# Patient Record
Sex: Female | Born: 1940 | Race: White | Hispanic: No | Marital: Single | Smoking: Former smoker
Health system: Southern US, Community
[De-identification: ages and names within clinical notes are randomized; demographics above are authoritative.]

## PROBLEM LIST (undated history)

## (undated) DIAGNOSIS — G309 Alzheimer's disease, unspecified: Secondary | ICD-10-CM

## (undated) DIAGNOSIS — F0280 Dementia in other diseases classified elsewhere without behavioral disturbance: Secondary | ICD-10-CM

---

## 2008-06-25 ENCOUNTER — Ambulatory Visit: Payer: Self-pay | Admitting: Family Medicine

## 2009-06-23 ENCOUNTER — Ambulatory Visit: Payer: Self-pay | Admitting: Family Medicine

## 2011-10-05 ENCOUNTER — Ambulatory Visit: Payer: Self-pay | Admitting: Family Medicine

## 2013-08-05 DIAGNOSIS — F0390 Unspecified dementia without behavioral disturbance: Secondary | ICD-10-CM | POA: Insufficient documentation

## 2015-10-26 DIAGNOSIS — R441 Visual hallucinations: Secondary | ICD-10-CM | POA: Insufficient documentation

## 2017-01-03 ENCOUNTER — Emergency Department
Admission: EM | Admit: 2017-01-03 | Discharge: 2017-01-04 | Disposition: A | Discharge status: Home / Self Care | Payer: Medicare Other | Attending: Emergency Medicine | Admitting: Emergency Medicine

## 2017-01-03 ENCOUNTER — Encounter: Payer: Self-pay | Admitting: Emergency Medicine

## 2017-01-03 DIAGNOSIS — E039 Hypothyroidism, unspecified: Secondary | ICD-10-CM | POA: Insufficient documentation

## 2017-01-03 DIAGNOSIS — R112 Nausea with vomiting, unspecified: Secondary | ICD-10-CM | POA: Diagnosis present

## 2017-01-03 DIAGNOSIS — N390 Urinary tract infection, site not specified: Secondary | ICD-10-CM | POA: Diagnosis not present

## 2017-01-03 DIAGNOSIS — K8020 Calculus of gallbladder without cholecystitis without obstruction: Secondary | ICD-10-CM | POA: Diagnosis not present

## 2017-01-03 LAB — CBC WITH DIFFERENTIAL/PLATELET
BASOS ABS: 0 10*3/uL (ref 0–0.1)
Basophils Relative: 0 %
EOS PCT: 1 %
Eosinophils Absolute: 0.1 10*3/uL (ref 0–0.7)
HEMATOCRIT: 38.3 % (ref 35.0–47.0)
Hemoglobin: 12.5 g/dL (ref 12.0–16.0)
LYMPHS PCT: 4 %
Lymphs Abs: 0.7 10*3/uL — ABNORMAL LOW (ref 1.0–3.6)
MCH: 28.2 pg (ref 26.0–34.0)
MCHC: 32.6 g/dL (ref 32.0–36.0)
MCV: 86.6 fL (ref 80.0–100.0)
MONO ABS: 1.2 10*3/uL — AB (ref 0.2–0.9)
MONOS PCT: 7 %
NEUTROS ABS: 15.1 10*3/uL — AB (ref 1.4–6.5)
Neutrophils Relative %: 88 %
PLATELETS: 222 10*3/uL (ref 150–440)
RBC: 4.42 MIL/uL (ref 3.80–5.20)
RDW: 16.2 % — AB (ref 11.5–14.5)
WBC: 17.1 10*3/uL — ABNORMAL HIGH (ref 3.6–11.0)

## 2017-01-03 MED ORDER — ONDANSETRON HCL 4 MG/2ML IJ SOLN
4.0000 mg | Freq: Once | INTRAMUSCULAR | Status: AC
  Administered 2017-01-03: 4 mg via INTRAVENOUS
  Filled 2017-01-03: qty 2

## 2017-01-03 MED ORDER — SODIUM CHLORIDE 0.9 % IV BOLUS (SEPSIS)
1000.0000 mL | Freq: Once | INTRAVENOUS | Status: AC
  Administered 2017-01-03: 1000 mL via INTRAVENOUS

## 2017-01-03 NOTE — ED Provider Notes (Signed)
[facility] Emergency Department Provider Note   ____________________________________________   First MD Initiated Contact with Patient 01/03/17 2316     (approximate)  I have reviewed the triage vital signs and the nursing notes.   HISTORY  Chief Complaint Emesis  Limited by dementia  HPI CERENITI CURB is a 76 y.o. female brought to the ED from [facility] by her son with a chief complaint of vomiting.Son was notified by nursing staff that patient has been vomiting today. To his knowledge, there has been no fever, chills, chest pain, shortness of breath, abdominal pain, dysuria, diarrhea. Denies recent travel, trauma or antibiotic use. Nothing makes her symptoms better or worse.   Past medical history Dementia Hypothyroidism  There are no active problems to display for this patient.   Past surgical history None  Prior to Admission medications   Medication Sig Start Date End Date Taking? Authorizing Provider  cephALEXin (KEFLEX) 250 MG capsule Take 1 capsule (250 mg total) by mouth 3 (three) times daily. 01/04/17   Irean Hong, MD  HYDROcodone-acetaminophen (NORCO) 5-325 MG tablet Take 1 tablet by mouth every 6 (six) hours as needed for moderate pain. 01/04/17   Irean Hong, MD  ondansetron (ZOFRAN ODT) 4 MG disintegrating tablet Take 1 tablet (4 mg total) by mouth every 8 (eight) hours as needed for nausea or vomiting. 01/04/17   Irean Hong, MD    Allergies Patient has no known allergies.  No family history on file.  Social History Social History  Substance Use Topics  . Smoking status: Not on file  . Smokeless tobacco: Not on file  . Alcohol use Not on file  Nonsmoker  Review of Systems  Constitutional: No fever/chills. Eyes: No visual changes. ENT: No sore throat. Cardiovascular: Denies chest pain. Respiratory: Denies shortness of breath. Gastrointestinal: No abdominal pain.  Positive for nausea and vomiting.  No diarrhea.  No  constipation. Genitourinary: Negative for dysuria. Musculoskeletal: Negative for back pain. Skin: Negative for rash. Neurological: Negative for headaches, focal weakness or numbness.   ____________________________________________   PHYSICAL EXAM:  VITAL SIGNS: ED Triage Vitals  Enc Vitals Group     BP 01/03/17 2302 115/62     Pulse Rate 01/03/17 2302 72     Resp 01/03/17 2302 20     Temp 01/03/17 2302 98.3 F (36.8 C)     Temp Source 01/03/17 2302 Oral     SpO2 01/03/17 2302 96 %     Weight 01/03/17 2303 143 lb (64.9 kg)     Height 01/03/17 2303  (1.6 m)     Head Circumference --      Peak Flow --      Pain Score --      Pain Loc --      Pain Edu? --      Excl. in GC? --     Constitutional: Alert and oriented. Well appearing and in no acute distress. Eyes: Conjunctivae are normal. PERRL. EOMI. Head: Atraumatic. Nose: No congestion/rhinnorhea. Mouth/Throat: Mucous membranes are moist.  Oropharynx non-erythematous. Neck: No stridor.   Cardiovascular: Normal rate, regular rhythm. Grossly normal heart sounds.  Good peripheral circulation. Respiratory: Normal respiratory effort.  No retractions. Lungs CTAB. Gastrointestinal: Soft and nontender to light or deep palpation. No distention. No abdominal bruits. No CVA tenderness. Musculoskeletal: No lower extremity tenderness nor edema.  No joint effusions. Neurologic:  Normal speech and language. Pleasantly confused. No gross focal neurologic deficits are appreciated.  Skin:  Skin is warm, dry and intact. No rash noted. Psychiatric: Mood and affect are normal. Speech and behavior are normal.  ____________________________________________   LABS (all labs ordered are listed, but only abnormal results are displayed)  Labs Reviewed  CBC WITH DIFFERENTIAL/PLATELET - Abnormal; Notable for the following:       Result Value   WBC 17.1 (*)    RDW 16.2 (*)    Neutro Abs 15.1 (*)    Lymphs Abs 0.7 (*)    Monocytes Absolute  1.2 (*)    All other components within normal limits  COMPREHENSIVE METABOLIC PANEL - Abnormal; Notable for the following:    Potassium 3.3 (*)    Glucose, Bld 104 (*)    ALT 11 (*)    All other components within normal limits  URINALYSIS, COMPLETE (UACMP) WITH MICROSCOPIC - Abnormal; Notable for the following:    Color, Urine YELLOW (*)    APPearance HAZY (*)    Leukocytes, UA TRACE (*)    Squamous Epithelial / LPF 0-5 (*)    All other components within normal limits  LIPASE, BLOOD  TROPONIN I   ____________________________________________  EKG  ED ECG REPORT I, Deval Mroczka J, the attending physician, personally viewed and interpreted this ECG.   Date: 01/04/2017  EKG Time: 0000  Rate: 60  Rhythm: normal EKG, normal sinus rhythm  Axis: Normal  Intervals:none  ST&T Change: Nonspecific  ____________________________________________  RADIOLOGY  CT abdomen and pelvis interpreted per Dr. Clovis Riley: 1. Hiatal hernia.  2. Uncomplicated mild diverticulosis.  3. Cholelithiasis  4. No bowel obstruction or perforation. No focal inflammation. No  ascites.   Ultrasound interpreted per Dr. Phill Myron: 1. Cholelithiasis with no additional sonographic features to suggest  acute cholecystitis.  2. No biliary dilatation.   ____________________________________________   PROCEDURES  Procedure(s) performed: None  Procedures  Critical Care performed: No  ____________________________________________   INITIAL IMPRESSION / ASSESSMENT AND PLAN / ED COURSE  Pertinent labs & imaging results that were available during my care of the patient were reviewed by me and considered in my medical decision making (see chart for details).  76 year old female brought from nursing facility for vomiting. She is afebrile, normotensive without appreciable abdominal tenderness on exam. Will obtain screening lab work including troponin, urinalysis, initiate IV fluid resuscitation with IV antiemetic  and reassess.  Clinical Course as of Jan 04 430  Thu Jan 04, 2017  0039 Updated patient and son of laboratory urinalysis results. Concerned that trace leukocytes in urine does not adequately explain patient's leukocytosis. Will proceed with CT abdomen/pelvis to evaluate for intra-abdominal pathology. Currently patient states nausea has improved after IV Zofran.  [JS]  0154 Updated patient and son of CT imaging results. Will proceed to ultrasound to evaluate for cholecystitis. Patient resting; no emesis since being in the emergency department.  [JS]  0423 Patient resting in no acute distress. Eager for discharge home. She has not vomited in the 5+ hours since she has been in the emergency department and she has tolerated 2 bottles of oral contrast without difficulty. Will place patient on Keflex for mild UTI. She will follow-up with general surgery on an outpatient basis. Strict return precautions given. Patient and son verbalize understanding and agree with plan of care.  [JS]    Clinical Course User Index [JS] Irean Hong, MD     ____________________________________________   FINAL CLINICAL IMPRESSION(S) / ED DIAGNOSES  Final diagnoses:  Non-intractable vomiting with nausea, unspecified vomiting type  Calculus of  gallbladder without cholecystitis without obstruction  Urinary tract infection without hematuria, site unspecified      NEW MEDICATIONS STARTED DURING THIS VISIT:  New Prescriptions   CEPHALEXIN (KEFLEX) 250 MG CAPSULE    Take 1 capsule (250 mg total) by mouth 3 (three) times daily.   HYDROCODONE-ACETAMINOPHEN (NORCO) 5-325 MG TABLET    Take 1 tablet by mouth every 6 (six) hours as needed for moderate pain.   ONDANSETRON (ZOFRAN ODT) 4 MG DISINTEGRATING TABLET    Take 1 tablet (4 mg total) by mouth every 8 (eight) hours as needed for nausea or vomiting.     Note:  This document was prepared using Dragon voice recognition software and may include unintentional dictation  errors.    Irean Hong, MD 01/04/17 [phone]

## 2017-01-03 NOTE — ED Triage Notes (Signed)
Pt in with co vomiting today, denies any diarrhea or dysuria. Pt has hx of alzheimers son here with her, pt is poor historian.

## 2017-01-04 ENCOUNTER — Emergency Department: Payer: Medicare Other

## 2017-01-04 ENCOUNTER — Encounter: Payer: Self-pay | Admitting: Radiology

## 2017-01-04 DIAGNOSIS — K8020 Calculus of gallbladder without cholecystitis without obstruction: Secondary | ICD-10-CM | POA: Diagnosis not present

## 2017-01-04 LAB — URINALYSIS, COMPLETE (UACMP) WITH MICROSCOPIC
BILIRUBIN URINE: NEGATIVE
Bacteria, UA: NONE SEEN
GLUCOSE, UA: NEGATIVE mg/dL
HGB URINE DIPSTICK: NEGATIVE
KETONES UR: NEGATIVE mg/dL
NITRITE: NEGATIVE
PH: 5 (ref 5.0–8.0)
PROTEIN: NEGATIVE mg/dL
Specific Gravity, Urine: 1.023 (ref 1.005–1.030)

## 2017-01-04 LAB — COMPREHENSIVE METABOLIC PANEL
ALBUMIN: 4.2 g/dL (ref 3.5–5.0)
ALK PHOS: 60 U/L (ref 38–126)
ALT: 11 U/L — AB (ref 14–54)
ANION GAP: 7 (ref 5–15)
AST: 17 U/L (ref 15–41)
BUN: 14 mg/dL (ref 6–20)
CALCIUM: 9.2 mg/dL (ref 8.9–10.3)
CHLORIDE: 105 mmol/L (ref 101–111)
CO2: 25 mmol/L (ref 22–32)
Creatinine, Ser: 0.6 mg/dL (ref 0.44–1.00)
GFR calc non Af Amer: >60 mL/min (ref >60)
GLUCOSE: 104 mg/dL — AB (ref 65–99)
Potassium: 3.3 mmol/L — ABNORMAL LOW (ref 3.5–5.1)
SODIUM: 137 mmol/L (ref 135–145)
Total Bilirubin: 0.6 mg/dL (ref 0.3–1.2)
Total Protein: 7.4 g/dL (ref 6.5–8.1)

## 2017-01-04 LAB — LIPASE, BLOOD: Lipase: 34 U/L (ref 11–51)

## 2017-01-04 LAB — TROPONIN I

## 2017-01-04 MED ORDER — ONDANSETRON 4 MG PO TBDP: 4.0000 mg | ORAL_TABLET | Freq: Three times a day (TID) | ORAL | 0 refills | Status: DC | PRN

## 2017-01-04 MED ORDER — IOPAMIDOL (ISOVUE-300) INJECTION 61%
100.0000 mL | Freq: Once | INTRAVENOUS | Status: AC | PRN
  Administered 2017-01-04: 100 mL via INTRAVENOUS

## 2017-01-04 MED ORDER — CEPHALEXIN 500 MG PO CAPS
500.0000 mg | ORAL_CAPSULE | Freq: Once | ORAL | Status: AC
  Administered 2017-01-04: 500 mg via ORAL
  Filled 2017-01-04: qty 1

## 2017-01-04 MED ORDER — CEPHALEXIN 250 MG PO CAPS: 250.0000 mg | ORAL_CAPSULE | Freq: Three times a day (TID) | ORAL | 0 refills | Status: DC

## 2017-01-04 MED ORDER — HYDROCODONE-ACETAMINOPHEN 5-325 MG PO TABS: 1.0000 | ORAL_TABLET | Freq: Four times a day (QID) | ORAL | 0 refills | Status: DC | PRN

## 2017-01-04 MED ORDER — IOPAMIDOL (ISOVUE-300) INJECTION 61%
30.0000 mL | Freq: Once | INTRAVENOUS | Status: AC | PRN
  Administered 2017-01-04: 30 mL via ORAL

## 2017-01-04 NOTE — Discharge Instructions (Signed)
1. Take antibiotic as prescribed (Keflex 250 mg 3 times daily 7 days). 2. You may take pain and nausea medicines as needed (Norco/Zofran #15). 3. Return to the ER for worsening symptoms, persistent vomiting, difficulty breathing or other concerns.

## 2017-09-21 ENCOUNTER — Emergency Department
Admission: EM | Admit: 2017-09-21 | Discharge: 2017-09-21 | Disposition: A | Discharge status: Home / Self Care | Payer: Medicare Other | Attending: Emergency Medicine | Admitting: Emergency Medicine

## 2017-09-21 ENCOUNTER — Emergency Department: Payer: Medicare Other

## 2017-09-21 DIAGNOSIS — R4182 Altered mental status, unspecified: Secondary | ICD-10-CM | POA: Insufficient documentation

## 2017-09-21 DIAGNOSIS — G309 Alzheimer's disease, unspecified: Secondary | ICD-10-CM | POA: Diagnosis not present

## 2017-09-21 DIAGNOSIS — Z79899 Other long term (current) drug therapy: Secondary | ICD-10-CM | POA: Diagnosis not present

## 2017-09-21 DIAGNOSIS — F0280 Dementia in other diseases classified elsewhere without behavioral disturbance: Secondary | ICD-10-CM | POA: Insufficient documentation

## 2017-09-21 HISTORY — DX: Dementia in other diseases classified elsewhere, unspecified severity, without behavioral disturbance, psychotic disturbance, mood disturbance, and anxiety: F02.80

## 2017-09-21 HISTORY — DX: Alzheimer's disease, unspecified: G30.9

## 2017-09-21 LAB — URINALYSIS, ROUTINE W REFLEX MICROSCOPIC
BILIRUBIN URINE: NEGATIVE
Glucose, UA: NEGATIVE mg/dL
Hgb urine dipstick: NEGATIVE
KETONES UR: NEGATIVE mg/dL
LEUKOCYTES UA: NEGATIVE
NITRITE: NEGATIVE
Protein, ur: NEGATIVE mg/dL
SPECIFIC GRAVITY, URINE: 1.021 (ref 1.005–1.030)
pH: 6 (ref 5.0–8.0)

## 2017-09-21 LAB — URINE DRUG SCREEN, QUALITATIVE (ARMC ONLY)
Amphetamines, Ur Screen: NOT DETECTED
Barbiturates, Ur Screen: NOT DETECTED
Benzodiazepine, Ur Scrn: NOT DETECTED
CANNABINOID 50 NG, UR ~~LOC~~: NOT DETECTED
Cocaine Metabolite,Ur ~~LOC~~: NOT DETECTED
MDMA (ECSTASY) UR SCREEN: NOT DETECTED
METHADONE SCREEN, URINE: NOT DETECTED
Opiate, Ur Screen: NOT DETECTED
Phencyclidine (PCP) Ur S: NOT DETECTED
TRICYCLIC, UR SCREEN: NOT DETECTED

## 2017-09-21 LAB — CBC
HEMATOCRIT: 37.5 % (ref 35.0–47.0)
Hemoglobin: 12.8 g/dL (ref 12.0–16.0)
MCH: 29.6 pg (ref 26.0–34.0)
MCHC: 34 g/dL (ref 32.0–36.0)
MCV: 87 fL (ref 80.0–100.0)
PLATELETS: 208 10*3/uL (ref 150–440)
RBC: 4.31 MIL/uL (ref 3.80–5.20)
RDW: 16.1 % — ABNORMAL HIGH (ref 11.5–14.5)
WBC: 6.1 10*3/uL (ref 3.6–11.0)

## 2017-09-21 LAB — COMPREHENSIVE METABOLIC PANEL
ALT: 9 U/L — ABNORMAL LOW (ref 14–54)
ANION GAP: 8 (ref 5–15)
AST: 16 U/L (ref 15–41)
Albumin: 4 g/dL (ref 3.5–5.0)
Alkaline Phosphatase: 68 U/L (ref 38–126)
BILIRUBIN TOTAL: 0.5 mg/dL (ref 0.3–1.2)
BUN: 15 mg/dL (ref 6–20)
CO2: 26 mmol/L (ref 22–32)
Calcium: 9 mg/dL (ref 8.9–10.3)
Chloride: 105 mmol/L (ref 101–111)
Creatinine, Ser: 0.68 mg/dL (ref 0.44–1.00)
Glucose, Bld: 93 mg/dL (ref 65–99)
POTASSIUM: 3.9 mmol/L (ref 3.5–5.1)
Sodium: 139 mmol/L (ref 135–145)
TOTAL PROTEIN: 7.3 g/dL (ref 6.5–8.1)

## 2017-09-21 LAB — TSH: TSH: 0.134 u[IU]/mL — AB (ref 0.350–4.500)

## 2017-09-21 LAB — TROPONIN I

## 2017-09-21 NOTE — ED Notes (Signed)
Pt has dementia at baseline. Per son, when he arrived around lunch time to [facility] (memory care side), it took him 20 mins to get pt out of bed, which is "very abnormal." He noticed pinpoint pupils at that time and that her right lower forearm has a scratch with two bruised areas which was wrapped. Per facility, they "have no idea what happened or how it got wrapped." Pt pleasant and appears to be in NAD at this time. Son at bedside. VSS.

## 2017-09-21 NOTE — ED Notes (Signed)
Pt ambulatory to wheel chair upon discharge. Pt son (and POA) verbalized understanding of discharge instructions and follow-up. VSS. Skin warm and dry. At baseline for mentation. Pt son is bringing pt home with him rather than to the facility.

## 2017-09-21 NOTE — ED Notes (Signed)
Per lab, troponin and TSH to be added on to blood work already sent.

## 2017-09-21 NOTE — ED Provider Notes (Signed)
[facility] Emergency Department Provider Note   ____________________________________________   First MD Initiated Contact with Patient 09/21/17 1523     (approximate)  I have reviewed the triage vital signs and the nursing notes.   HISTORY  Chief Complaint Altered Mental Status    HPI Mandy Reyes is a 77 y.o. female Patient's son brought her in. He says she has not been acting like herself she's been very sleepy her pupils are pinpoint and her right lower arm has 2 bruises and 3 little scratches on it which could be from somebody's nails perhaps per the facility they had no idea what happened to her wrist to right wrist with a scratches are and how got wrapped. Patient's son reports at the time I saw her she is much more awake.I reviewed her medication list she does have a prescription for hydrocodone when necessary. She denies any pain anywhere at present she is awake and alert although she still has pinpoint pupils.   Past Medical History:  Diagnosis Date  . Alzheimer's dementia     There are no active problems to display for this patient.   History reviewed. No pertinent surgical history.  Prior to Admission medications   Medication Sig Start Date End Date Taking? Authorizing Provider  cephALEXin (KEFLEX) 250 MG capsule Take 1 capsule (250 mg total) by mouth 3 (three) times daily. 01/04/17   Irean Hong, MD  HYDROcodone-acetaminophen (NORCO) 5-325 MG tablet Take 1 tablet by mouth every 6 (six) hours as needed for moderate pain. 01/04/17   Irean Hong, MD  ondansetron (ZOFRAN ODT) 4 MG disintegrating tablet Take 1 tablet (4 mg total) by mouth every 8 (eight) hours as needed for nausea or vomiting. 01/04/17   Irean Hong, MD    Allergies Patient has no known allergies.  History reviewed. No pertinent family history.  Social History Social History   Tobacco Use  . Smoking status: Not on file  Substance Use Topics  . Alcohol use: No   Frequency: Never  . Drug use: No    Review of Systems  Constitutional: No fever/chills Eyes: No visual changes. ENT: No sore throat. Cardiovascular: Denies chest pain. Respiratory: Denies shortness of breath. Gastrointestinal: No abdominal pain.  No nausea, no vomiting.  No diarrhea.  No constipation. Genitourinary: Negative for dysuria. Musculoskeletal: Negative for back pain. Skin: Negative for rash. Neurological: Negative for headaches, focal weakness   ____________________________________________   PHYSICAL EXAM:  VITAL SIGNS: ED Triage Vitals [09/21/17 1402]  Enc Vitals Group     BP 111/70     Pulse Rate 68     Resp 18     Temp 98.2 F (36.8 C)     Temp Source Oral     SpO2 95 %     Weight 143 lb (64.9 kg)     Height      Head Circumference      Peak Flow      Pain Score      Pain Loc      Pain Edu?      Excl. in GC?     Constitutional: Alert. Well appearing and in no acute distress. Eyes: Conjunctivae are normal.  Head: Atraumatic. Nose: No congestion/rhinnorhea. Mouth/Throat: Mucous membranes are moist.  Oropharynx non-erythematous. Neck: No stridor.   Cardiovascular: Normal rate, regular rhythm. Grossly normal heart sounds.  Good peripheral circulation. Respiratory: Normal respiratory effort.  No retractions. Lungs CTAB. Gastrointestinal: Soft and nontender. No distention. No  abdominal bruits. No CVA tenderness. Musculoskeletal: No lower extremity tenderness nor edema.  No joint effusions. Neurologic:  Normal speech and language. No gross focal neurologic deficits are appreciated. No gait instability. Skin:  Skin is warm, dry and intact. No rash noted. Psychiatric: Mood and affect are normal. Speech and behavior are normal.  ____________________________________________   LABS (all labs ordered are listed, but only abnormal results are displayed)  Labs Reviewed  CBC - Abnormal; Notable for the following components:      Result Value   RDW 16.1  (*)    All other components within normal limits  COMPREHENSIVE METABOLIC PANEL - Abnormal; Notable for the following components:   ALT 9 (*)    All other components within normal limits  URINALYSIS, ROUTINE W REFLEX MICROSCOPIC - Abnormal; Notable for the following components:   Color, Urine YELLOW (*)    APPearance CLEAR (*)    All other components within normal limits  TSH - Abnormal; Notable for the following components:   TSH 0.134 (*)    All other components within normal limits  URINE DRUG SCREEN, QUALITATIVE ([facility] ONLY)  TROPONIN I   ____________________________________________  EKG  EKG read and interpreted by me shows sinus bradycardia rate of 52 normal axis and low voltage and some of the precordial leads but no acute ST-T changes _  the patient on the monitor nurses noted that she appeared to be in a flutter. EKG was repeated shows a flutter with a heart rate of 53 still normal axis not seeing any acute ST-T wave changes______  within 5 minutes patient's back out of a flutter into normal sinus rhythm_____________________________________  RADIOLOGY    ____________________________________________   PROCEDURES  Procedure(s) performed:   Procedures  Critical Care performed:   ____________________________________________   INITIAL IMPRESSION / ASSESSMENT AND PLAN / ED COURSE    Clinical Course as of Sep 21 2142  Fri Sep 21, 2017  1708 Chloride: 105 [PM]    Clinical Course User Index [PM] Arnaldo NatalMalinda, Ameenah Prosser F, MD     ____________________________________________   FINAL CLINICAL IMPRESSION(S) / ED DIAGNOSES  Final diagnoses:  Altered mental status, unspecified altered mental status type     ED Discharge Orders    None       Note:  This document was prepared using Dragon voice recognition software and may include unintentional dictation errors.    Arnaldo NatalMalinda, Halle Davlin F, MD 09/21/17 2144

## 2017-09-21 NOTE — ED Triage Notes (Signed)
Pt brought in by son who has legal power of attorney and presents paperwork with him.  Son picked pt up from [facility] today and states she is in the Alzheimer's wing there.  Per son, pt is acting more lethargic and different from her baseline.  Per son, he feels that pt is been given medication that is not on her medication list and is unable to get any answers from the facility.  Facility provided med list, but per son this is very out of the ordinary for his mother.  Pt's pupils are pinpoint and nonreactive to light at this time during assessment.  Per son they have been like this for 2 hours.  Pt is able to talk, but is not oriented to anything, but herself at this time.

## 2017-09-21 NOTE — Discharge Instructions (Signed)
please return for any further problems. At present all the tests CAT scan etc. are negative. since she is back at baseline I will let her go.

## 2017-09-21 NOTE — ED Notes (Signed)
ED Provider at bedside. 

## 2018-01-05 ENCOUNTER — Emergency Department: Payer: Medicare Other

## 2018-01-05 ENCOUNTER — Emergency Department
Admission: EM | Admit: 2018-01-05 | Discharge: 2018-01-05 | Disposition: A | Discharge status: Home / Self Care | Payer: Medicare Other | Attending: Emergency Medicine | Admitting: Emergency Medicine

## 2018-01-05 ENCOUNTER — Other Ambulatory Visit: Payer: Self-pay

## 2018-01-05 DIAGNOSIS — R42 Dizziness and giddiness: Secondary | ICD-10-CM | POA: Diagnosis present

## 2018-01-05 DIAGNOSIS — F0280 Dementia in other diseases classified elsewhere without behavioral disturbance: Secondary | ICD-10-CM | POA: Insufficient documentation

## 2018-01-05 DIAGNOSIS — E860 Dehydration: Secondary | ICD-10-CM | POA: Diagnosis not present

## 2018-01-05 DIAGNOSIS — Z79899 Other long term (current) drug therapy: Secondary | ICD-10-CM | POA: Diagnosis not present

## 2018-01-05 DIAGNOSIS — G309 Alzheimer's disease, unspecified: Secondary | ICD-10-CM | POA: Insufficient documentation

## 2018-01-05 DIAGNOSIS — N390 Urinary tract infection, site not specified: Secondary | ICD-10-CM | POA: Diagnosis not present

## 2018-01-05 LAB — URINALYSIS, COMPLETE (UACMP) WITH MICROSCOPIC
Bilirubin Urine: NEGATIVE
GLUCOSE, UA: NEGATIVE mg/dL
KETONES UR: NEGATIVE mg/dL
Leukocytes, UA: NEGATIVE
Nitrite: POSITIVE — AB
PROTEIN: NEGATIVE mg/dL
Specific Gravity, Urine: 1.011 (ref 1.005–1.030)
pH: 5 (ref 5.0–8.0)

## 2018-01-05 LAB — BASIC METABOLIC PANEL
Anion gap: 4 — ABNORMAL LOW (ref 5–15)
BUN: 19 mg/dL (ref 6–20)
CO2: 27 mmol/L (ref 22–32)
CREATININE: 0.66 mg/dL (ref 0.44–1.00)
Calcium: 8.5 mg/dL — ABNORMAL LOW (ref 8.9–10.3)
Chloride: 108 mmol/L (ref 101–111)
GFR calc Af Amer: >60 mL/min (ref >60)
Glucose, Bld: 109 mg/dL — ABNORMAL HIGH (ref 65–99)
Potassium: 3.7 mmol/L (ref 3.5–5.1)
SODIUM: 139 mmol/L (ref 135–145)

## 2018-01-05 LAB — TROPONIN I: Troponin I: <0.03 ng/mL (ref <0.03)

## 2018-01-05 LAB — CBC
HCT: 35.4 % (ref 35.0–47.0)
Hemoglobin: 12.1 g/dL (ref 12.0–16.0)
MCH: 29.8 pg (ref 26.0–34.0)
MCHC: 34.3 g/dL (ref 32.0–36.0)
MCV: 86.9 fL (ref 80.0–100.0)
Platelets: 149 10*3/uL — ABNORMAL LOW (ref 150–440)
RBC: 4.07 MIL/uL (ref 3.80–5.20)
RDW: 16 % — ABNORMAL HIGH (ref 11.5–14.5)
WBC: 7 10*3/uL (ref 3.6–11.0)

## 2018-01-05 LAB — MAGNESIUM: MAGNESIUM: 1.8 mg/dL (ref 1.7–2.4)

## 2018-01-05 MED ORDER — CEPHALEXIN 500 MG PO CAPS: 500.0000 mg | ORAL_CAPSULE | Freq: Three times a day (TID) | ORAL | 0 refills | Status: AC

## 2018-01-05 MED ORDER — SODIUM CHLORIDE 0.9 % IV BOLUS
500.0000 mL | Freq: Once | INTRAVENOUS | Status: AC
  Administered 2018-01-05: 500 mL via INTRAVENOUS

## 2018-01-05 MED ORDER — SODIUM CHLORIDE 0.9 % IV SOLN
1.0000 g | Freq: Once | INTRAVENOUS | Status: AC
  Administered 2018-01-05: 1 g via INTRAVENOUS
  Filled 2018-01-05: qty 10

## 2018-01-05 NOTE — ED Provider Notes (Addendum)
[facility] Emergency Department Provider Note  ____________________________________________   I have reviewed the triage vital signs and the nursing notes. Where available I have reviewed prior notes and, if possible and indicated, outside hospital notes.    HISTORY  Chief Complaint Near Syncope    HPI JENINA MOENING is a 77 y.o. female presents today after feeling lightheaded when she was in the shower.  Patient was in the shower for about 10 to 12 minutes, with a heat up, and became lightheaded.  Did not pass out.  She was mildly orthostatic with EMS.  They gave her some fluid she has no complaints.  There is no antecedent complaints she does not complain of chest pain shortness breath nausea vomiting diarrhea dysuria or urinary tract infection symptoms.  Her family member state that she is at baseline.  Nontender to have any chest pain, she did not feel that she was going to pass out she states she felt lightheaded however.     Past Medical History:  Diagnosis Date  . Alzheimer's dementia     There are no active problems to display for this patient.   No past surgical history on file.  Prior to Admission medications   Medication Sig Start Date End Date Taking? Authorizing Provider  cephALEXin (KEFLEX) 250 MG capsule Take 1 capsule (250 mg total) by mouth 3 (three) times daily. 01/04/17   Irean Hong, MD  HYDROcodone-acetaminophen (NORCO) 5-325 MG tablet Take 1 tablet by mouth every 6 (six) hours as needed for moderate pain. 01/04/17   Irean Hong, MD  ondansetron (ZOFRAN ODT) 4 MG disintegrating tablet Take 1 tablet (4 mg total) by mouth every 8 (eight) hours as needed for nausea or vomiting. 01/04/17   Irean Hong, MD    Allergies Patient has no known allergies.  No family history on file.  Social History Social History   Tobacco Use  . Smoking status: Not on file  Substance Use Topics  . Alcohol use: No    Frequency: Never  . Drug use: No     Review of Systems Constitutional: No fever/chills Eyes: No visual changes. ENT: No sore throat. No stiff neck no neck pain Cardiovascular: Denies chest pain. Respiratory: Denies shortness of breath. Gastrointestinal:   no vomiting.  No diarrhea.  No constipation. Genitourinary: Negative for dysuria. Musculoskeletal: Negative lower extremity swelling Skin: Negative for rash. Neurological: Negative for severe headaches, focal weakness or numbness.   ____________________________________________   PHYSICAL EXAM:  VITAL SIGNS: ED Triage Vitals  Enc Vitals Group     BP 01/05/18 1218 125/68     Pulse Rate 01/05/18 1218 60     Resp 01/05/18 1218 20     Temp 01/05/18 1218 97.7 F (36.5 C)     Temp Source 01/05/18 1218 Oral     SpO2 01/05/18 1218 97 %     Weight 01/05/18 1219 150 lb (68 kg)     Height 01/05/18 1219  (1.626 m)     Head Circumference --      Peak Flow --      Pain Score 01/05/18 1219 0     Pain Loc --      Pain Edu? --      Excl. in GC? --     Constitutional: Alert and oriented name and place unsure of the date, oriented at baseline per family. Well appearing and in no acute distress. Eyes: Conjunctivae are normal Head: Atraumatic HEENT: No congestion/rhinnorhea.  Mucous membranes are moist.  Oropharynx non-erythematous Neck:   Nontender with no meningismus, no masses, no stridor Cardiovascular: Normal rate, regular rhythm. Grossly normal heart sounds.  Good peripheral circulation. Respiratory: Normal respiratory effort.  No retractions. Lungs CTAB. Abdominal: Soft and nontender. No distention. No guarding no rebound Back:  There is no focal tenderness or step off.  there is no midline tenderness there are no lesions noted. there is no CVA tenderness Musculoskeletal: No lower extremity tenderness, no upper extremity tenderness. No joint effusions, no DVT signs strong distal pulses no edema Neurologic:  Normal speech and language. No gross focal  neurologic deficits are appreciated.  Skin:  Skin is warm, dry and intact. No rash noted. Psychiatric: Mood and affect are normal. Speech and behavior are normal.  ____________________________________________   LABS (all labs ordered are listed, but only abnormal results are displayed)  Labs Reviewed  BASIC METABOLIC PANEL  CBC  URINALYSIS, COMPLETE (UACMP) WITH MICROSCOPIC  MAGNESIUM  CBG MONITORING, ED    Pertinent labs  results that were available during my care of the patient were reviewed by me and considered in my medical decision making (see chart for details). ____________________________________________  EKG  I personally interpreted any EKGs ordered by me or triage Sinus rhythm at 61 bpm no acute ST elevation no acute ST depression normal axis unremarkable EKG ____________________________________________  RADIOLOGY  Pertinent labs & imaging results that were available during my care of the patient were reviewed by me and considered in my medical decision making (see chart for details). If possible, patient and/or family made aware of any abnormal findings.  No results found. ____________________________________________    PROCEDURES  Procedure(s) performed: None  Procedures  Critical Care performed: None  ____________________________________________   INITIAL IMPRESSION / ASSESSMENT AND PLAN / ED COURSE  Pertinent labs & imaging results that were available during my care of the patient were reviewed by me and considered in my medical decision making (see chart for details).  Took a long hot shower without having very much for breakfast and felt lightheaded, she has no ongoing complaints and looks well we will give her some fluid recheck orthostatics, check basic blood work urine and chest and reassess  ----------------------------------------- 4:47 PM on 01/05/2018 -----------------------------------------  Patient here with urinary tract infection of  feeling lightheaded after spending a long time in a hot shower.  She is a symptomatically we have given her IV fluids she feels fine, patient family prefer not to be admitted to the hospital which I do not think is unreasonable.  She certainly does not appear to be septic blood work is otherwise reassuring exam is reassuring on serial exam she does not have any symptoms or complaints she is at her baseline and would like to go home.  We did, however, as a precaution give her broad-spectrum antibiotics, urine culture is pending.    ____________________________________________   FINAL CLINICAL IMPRESSION(S) / ED DIAGNOSES  Final diagnoses:  None      This chart was dictated using voice recognition software.  Despite best efforts to proofread,  errors can occur which can change meaning.      Jeanmarie Plant, MD 01/05/18 1259    Jeanmarie Plant, MD 01/05/18 1647    Jeanmarie Plant, MD 01/05/18 [phone]

## 2018-01-05 NOTE — ED Notes (Signed)
Pt's son at bedside reports pt has Alzheimer's disease and thyroid problems denies any other medical problems reports pt has advanced Alzheimer's disease able to answer only simple questions per son

## 2018-01-05 NOTE — ED Notes (Signed)
Pt's son verbalizes understanding of discharge instructions. 

## 2018-01-05 NOTE — Discharge Instructions (Addendum)
Return to the emergency room for any new or worrisome symptoms including lightheadedness, vomiting, fever, inability to eat or drink, lethargy, abdominal pain back pain or any other concerns.  Follow close with primary care.  Take the antibiotics as directed.  If we need to change the antibiotics after the culture results we will let you know.

## 2018-01-05 NOTE — ED Triage Notes (Signed)
Pt presents to ER from home via EMS with complaints of near syncopal episode per EMS pt finished a hot shower reports pt called her son as she was feeling "like she was going to pass out" son help pt to the floor. Pt did not fall, EMS administered NS. EMS did Ortho VS, 20pt BP drop. Pt is awake, alert to self and place.

## 2018-01-08 LAB — URINE CULTURE: Culture: >=100000 — AB

## 2018-05-24 IMAGING — US US ABDOMEN LIMITED
1 series · 14 of 25 positions shown · non-contrast
Comparison: Prior CT from earlier same day.

CLINICAL DATA: Initial evaluation for cholelithiasis.

EXAM:
US ABDOMEN LIMITED - RIGHT UPPER QUADRANT

[Series 1: us abdomen limited · 0.19mm/px · 14 of 46 slices shown]
[im 1/46]
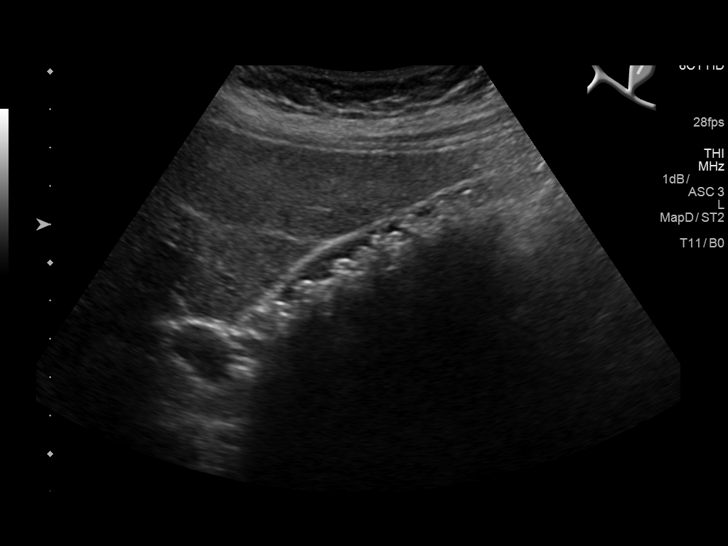
[im 4/46]
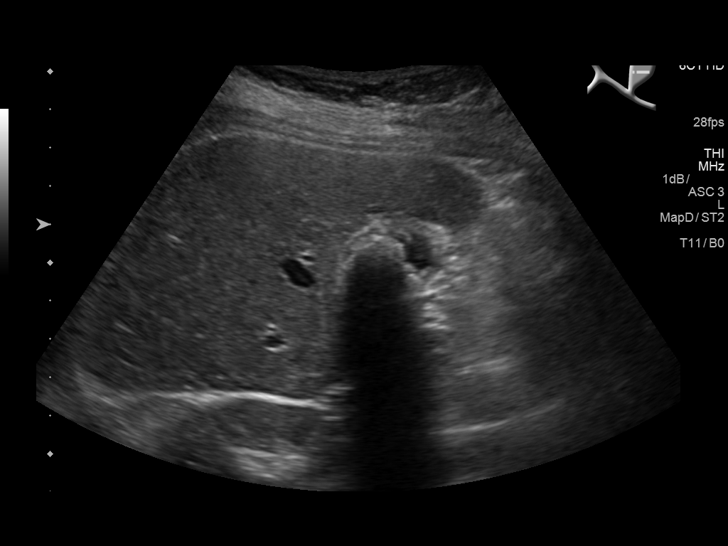
[im 8/46]
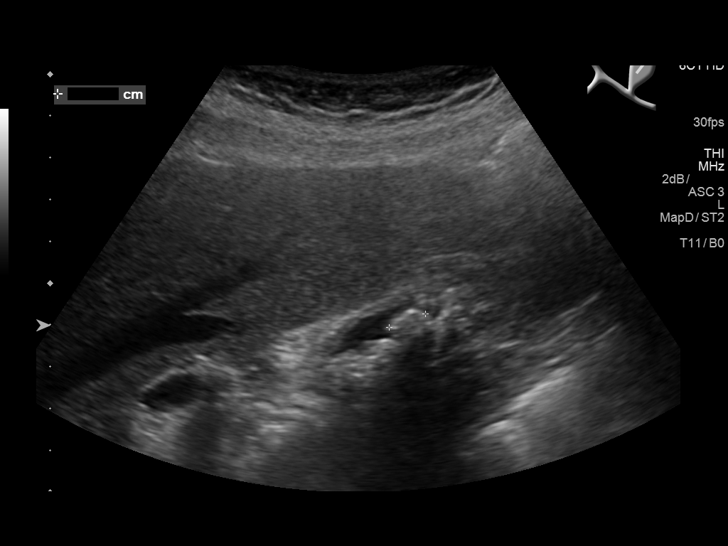
[im 12/46]
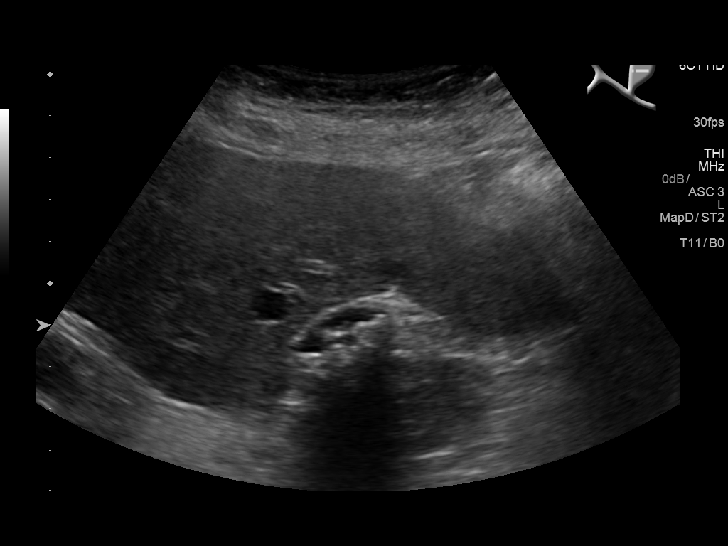
[im 16/46]
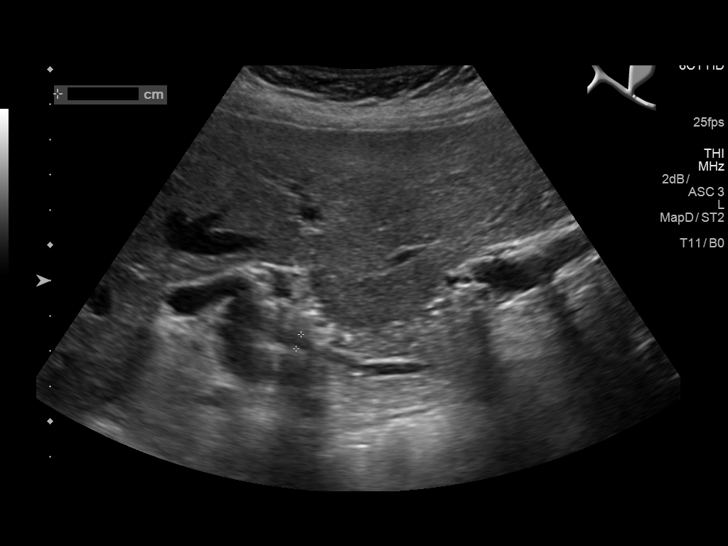
[im 17/46]
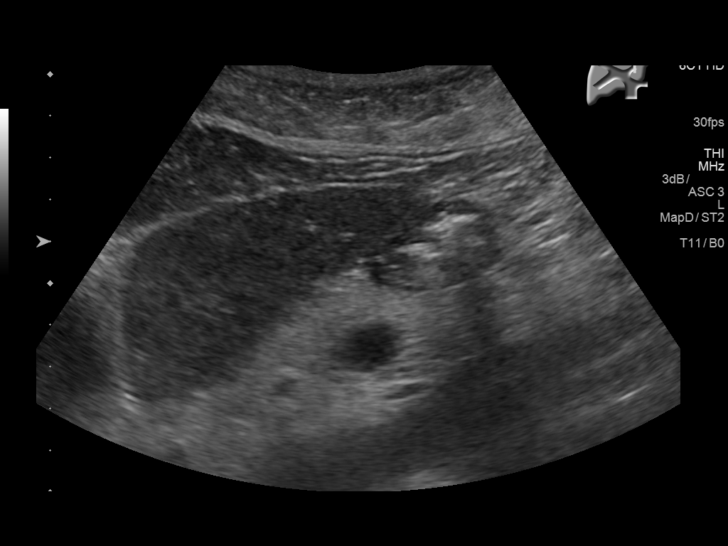
[im 21/46]
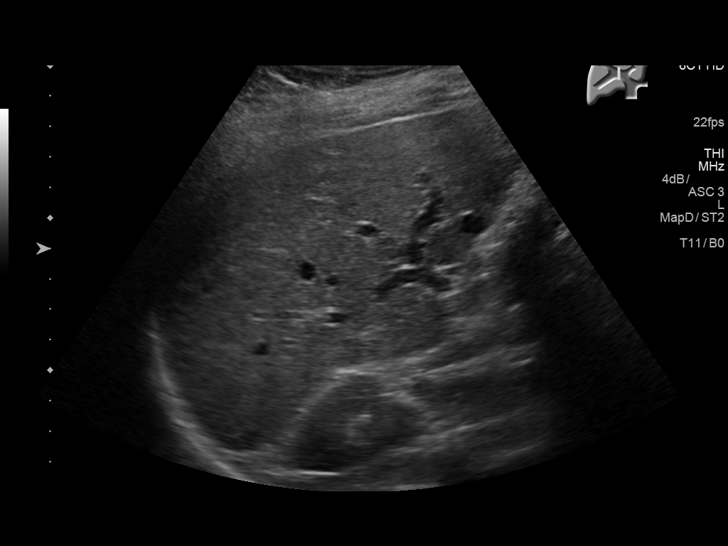
[im 25/46]
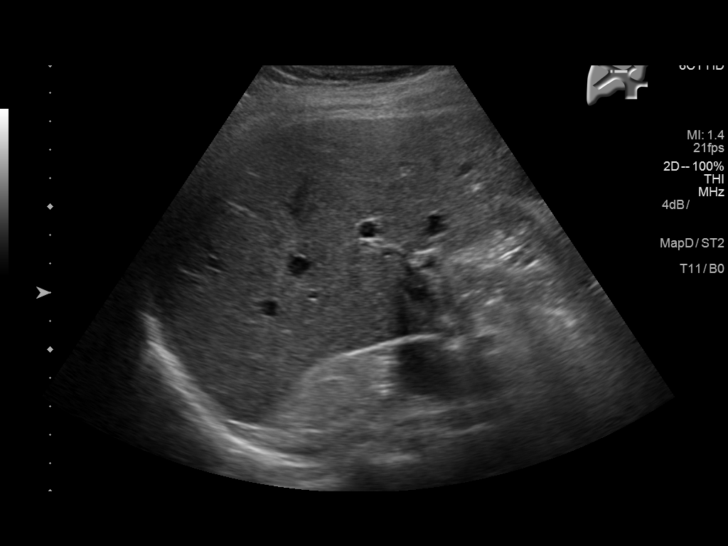
[im 29/46]
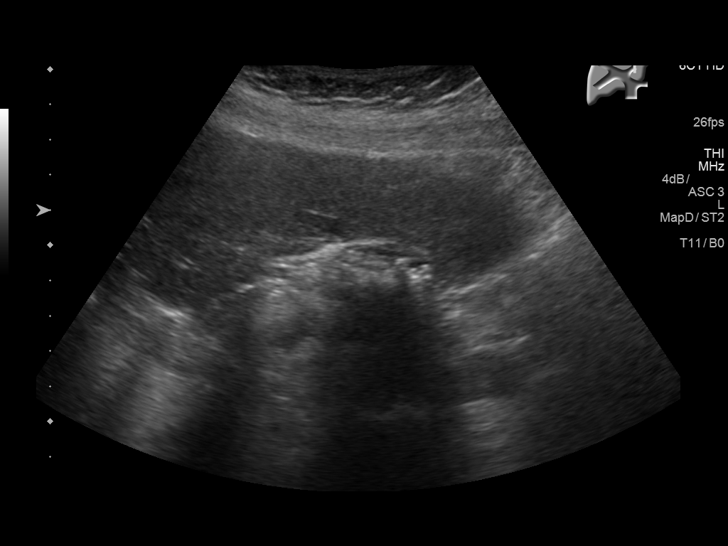
[im 31/46]
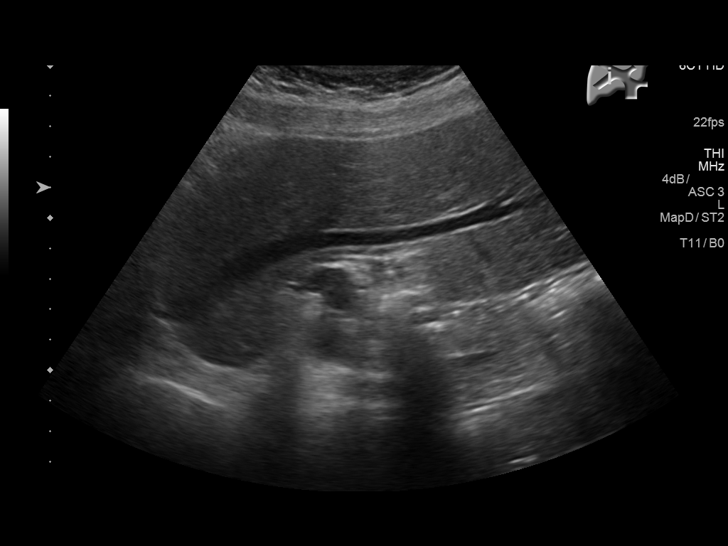
[im 34/46]
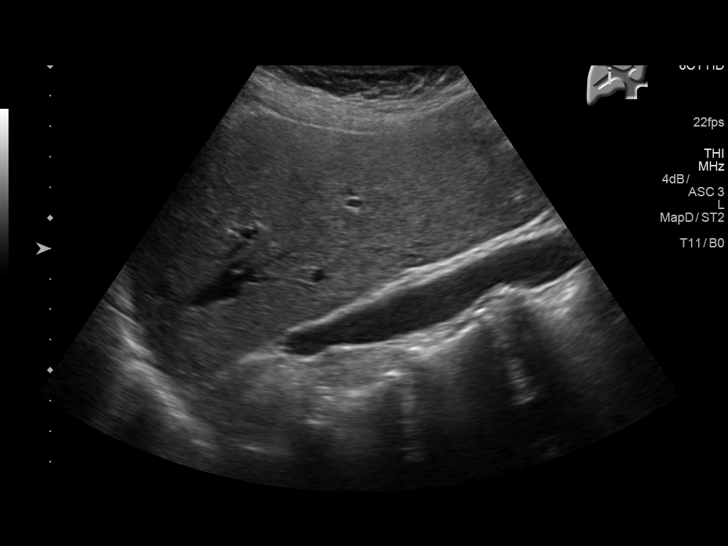
[im 38/46]
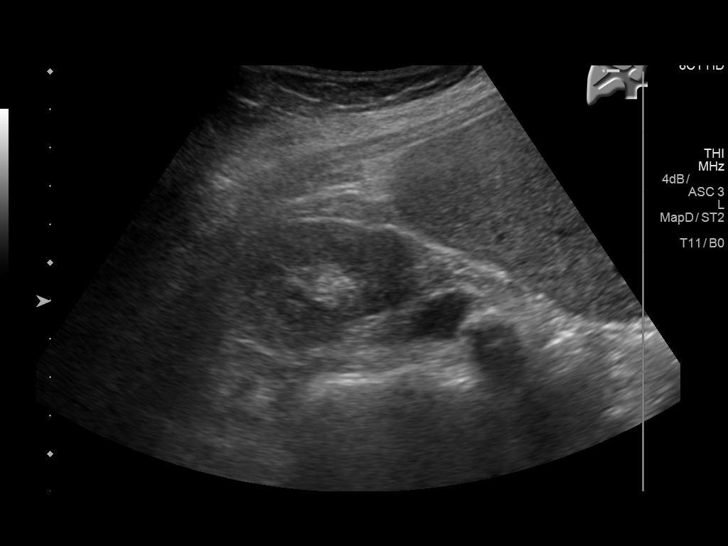
[im 42/46]
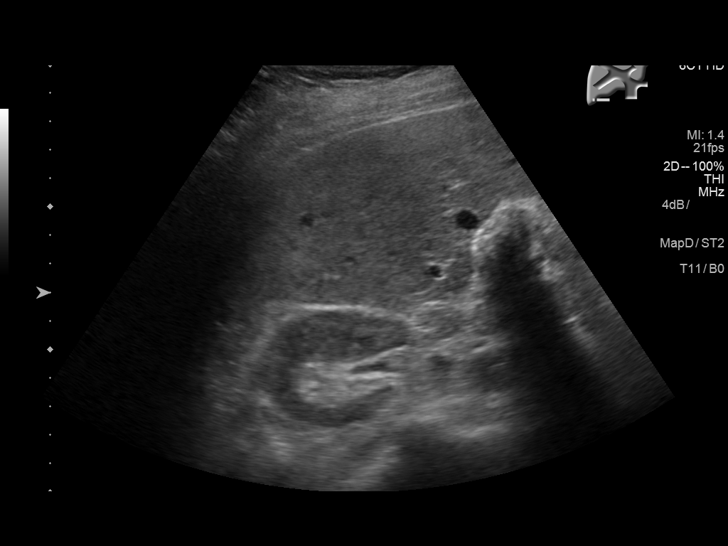
[im 46/46]
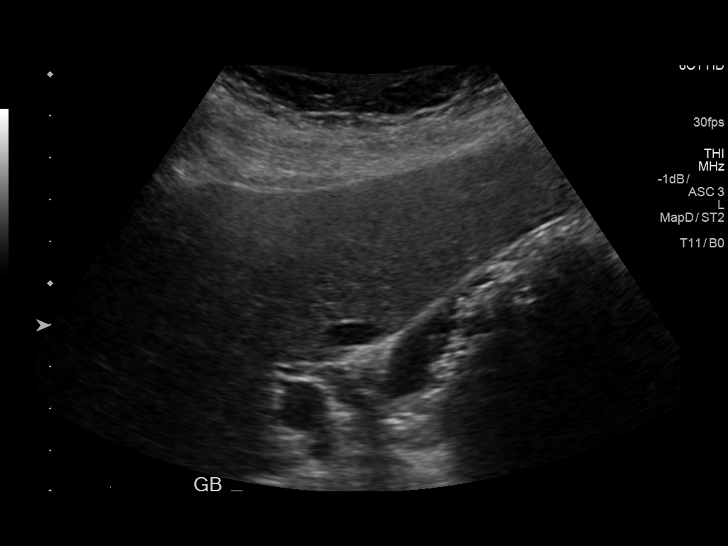

[14 of 25 positions shown; findings below may reference images not displayed]

FINDINGS: Gallbladder:

Multiple shadowing echogenic stones present within the gallbladder
lumen. Largest of these measured approximately 9 mm. No free
pericholecystic fluid. Gallbladder wall measure within normal limits
at 2.6 mm. No sonographic Murphy sign elicited on exam.

Common bile duct:

Diameter: 4.7 mm

Liver:

No focal lesion identified. Within normal limits in parenchymal
echogenicity.
IMPRESSION: 1. Cholelithiasis with no additional sonographic features to suggest
acute cholecystitis.
2. No biliary dilatation.

## 2018-11-04 ENCOUNTER — Other Ambulatory Visit: Payer: Self-pay

## 2018-11-04 ENCOUNTER — Encounter: Payer: Self-pay | Admitting: Emergency Medicine

## 2018-11-04 ENCOUNTER — Emergency Department: Payer: Medicare Other

## 2018-11-04 ENCOUNTER — Emergency Department
Admission: EM | Admit: 2018-11-04 | Discharge: 2018-11-04 | Disposition: A | Discharge status: Home / Self Care | Payer: Medicare Other | Attending: Emergency Medicine | Admitting: Emergency Medicine

## 2018-11-04 DIAGNOSIS — G309 Alzheimer's disease, unspecified: Secondary | ICD-10-CM | POA: Insufficient documentation

## 2018-11-04 DIAGNOSIS — F0390 Unspecified dementia without behavioral disturbance: Secondary | ICD-10-CM

## 2018-11-04 DIAGNOSIS — F0280 Dementia in other diseases classified elsewhere without behavioral disturbance: Secondary | ICD-10-CM | POA: Insufficient documentation

## 2018-11-04 DIAGNOSIS — R4182 Altered mental status, unspecified: Secondary | ICD-10-CM | POA: Diagnosis present

## 2018-11-04 LAB — URINALYSIS, COMPLETE (UACMP) WITH MICROSCOPIC
Bacteria, UA: NONE SEEN
Bilirubin Urine: NEGATIVE
GLUCOSE, UA: NEGATIVE mg/dL
Hgb urine dipstick: NEGATIVE
KETONES UR: NEGATIVE mg/dL
LEUKOCYTE UA: NEGATIVE
Nitrite: NEGATIVE
PH: 6 (ref 5.0–8.0)
Protein, ur: NEGATIVE mg/dL
Specific Gravity, Urine: 1.009 (ref 1.005–1.030)

## 2018-11-04 LAB — COMPREHENSIVE METABOLIC PANEL
ALT: 10 U/L (ref 0–44)
AST: 19 U/L (ref 15–41)
Albumin: 4 g/dL (ref 3.5–5.0)
Alkaline Phosphatase: 86 U/L (ref 38–126)
Anion gap: 9 (ref 5–15)
BILIRUBIN TOTAL: 0.7 mg/dL (ref 0.3–1.2)
BUN: 8 mg/dL (ref 8–23)
CALCIUM: 8.8 mg/dL — AB (ref 8.9–10.3)
CO2: 21 mmol/L — AB (ref 22–32)
CREATININE: 0.73 mg/dL (ref 0.44–1.00)
Chloride: 100 mmol/L (ref 98–111)
GFR calc Af Amer: >60 mL/min (ref >60)
Glucose, Bld: 122 mg/dL — ABNORMAL HIGH (ref 70–99)
Potassium: 3.7 mmol/L (ref 3.5–5.1)
Sodium: 130 mmol/L — ABNORMAL LOW (ref 135–145)
TOTAL PROTEIN: 7.3 g/dL (ref 6.5–8.1)

## 2018-11-04 LAB — CBC
HCT: 36.2 % (ref 36.0–46.0)
Hemoglobin: 11.8 g/dL — ABNORMAL LOW (ref 12.0–15.0)
MCH: 28.9 pg (ref 26.0–34.0)
MCHC: 32.6 g/dL (ref 30.0–36.0)
MCV: 88.7 fL (ref 80.0–100.0)
NRBC: 0 % (ref 0.0–0.2)
PLATELETS: 304 10*3/uL (ref 150–400)
RBC: 4.08 MIL/uL (ref 3.87–5.11)
RDW: 14.9 % (ref 11.5–15.5)
WBC: 6.6 10*3/uL (ref 4.0–10.5)

## 2018-11-04 LAB — TROPONIN I: Troponin I: <0.03 ng/mL (ref <0.03)

## 2018-11-04 MED ORDER — SODIUM CHLORIDE 0.9% FLUSH: 3.0000 mL | Freq: Once | INTRAVENOUS | Status: DC

## 2018-11-04 NOTE — ED Notes (Signed)
Placed pt on bed pan. The patient gave a urine sample. Cleaned pt up and is resting in bed.

## 2018-11-04 NOTE — ED Notes (Signed)
Patient transported to CT 

## 2018-11-04 NOTE — ED Notes (Signed)
Pt's son verbalized understanding of d/c instructions, and f/u care. No further questions at this time. Pt assisted to exit via wheelchair.

## 2018-11-04 NOTE — ED Provider Notes (Signed)
[facility] Emergency Department Provider Note       Time seen: ----------------------------------------- 4:28 PM on 11/04/2018 -----------------------------------------  Level V caveat: History/ROS limited by altered mental status I have reviewed the triage vital signs and the nursing notes.  HISTORY   Chief Complaint Altered Mental Status    HPI Mandy Reyes is a 78 y.o. female with a history of Alzheimer's dementia who presents to the ED for altered mental status for the past 24 to 48 hours.  Son takes care of her and states she has had worsening confusion and weakness over the past several days.  She is still eating and drinking normally, has had no recent change in her medicines.  Has not had any other specific symptoms.  Past Medical History:  Diagnosis Date  . Alzheimer's dementia (HCC)     There are no active problems to display for this patient.   History reviewed. No pertinent surgical history.  Allergies Patient has no known allergies.  Social History Social History   Tobacco Use  . Smoking status: Not on file  Substance Use Topics  . Alcohol use: No    Frequency: Never  . Drug use: No   Review of Systems Unknown, positive for altered mental status  All systems negative/normal/unremarkable except as stated in the HPI  ____________________________________________   PHYSICAL EXAM:  VITAL SIGNS: ED Triage Vitals  Enc Vitals Group     BP 11/04/18 1300 121/66     Pulse Rate 11/04/18 1300 95     Resp 11/04/18 1300 16     Temp 11/04/18 1300 98.7 F (37.1 C)     Temp Source 11/04/18 1300 Oral     SpO2 11/04/18 1300 98 %     Weight --      Height --      Head Circumference --      Peak Flow --      Pain Score 11/04/18 1258 0     Pain Loc --      Pain Edu? --      Excl. in GC? --    Constitutional: Alert but disoriented.  No acute distress Eyes: Conjunctivae are normal. Normal extraocular movements. ENT      Head:  Normocephalic and atraumatic.      Nose: No congestion/rhinnorhea.      Mouth/Throat: Mucous membranes are moist.      Neck: No stridor. Cardiovascular: Normal rate, regular rhythm. No murmurs, rubs, or gallops. Respiratory: Normal respiratory effort without tachypnea nor retractions. Breath sounds are clear and equal bilaterally. No wheezes/rales/rhonchi. Gastrointestinal: Soft and nontender. Normal bowel sounds Musculoskeletal: Nontender with normal range of motion in extremities. No lower extremity tenderness nor edema. Neurologic:  Normal speech and language. No gross focal neurologic deficits are appreciated.  Skin:  Skin is warm, dry and intact. No rash noted. Psychiatric: Mood and affect are normal. Speech and behavior are normal.  ____________________________________________  ED COURSE:  As part of my medical decision making, I reviewed the following data within the electronic MEDICAL RECORD NUMBER History obtained from family if available, nursing notes, old chart and ekg, as well as notes from prior ED visits. Patient presented for altered mental status, we will assess with labs and imaging as indicated at this time.   Procedures ____________________________________________   LABS (pertinent positives/negatives)  Labs Reviewed  COMPREHENSIVE METABOLIC PANEL - Abnormal; Notable for the following components:      Result Value   Sodium 130 (*)  CO2 21 (*)    Glucose, Bld 122 (*)    Calcium 8.8 (*)    All other components within normal limits  CBC - Abnormal; Notable for the following components:   Hemoglobin 11.8 (*)    All other components within normal limits  URINALYSIS, COMPLETE (UACMP) WITH MICROSCOPIC  TROPONIN I  CBG MONITORING, ED    RADIOLOGY Images were viewed by me  Chest x-ray, CT head Do not reveal any acute process ____________________________________________   DIFFERENTIAL DIAGNOSIS   Dehydration, electrolyte abnormality, occult infection, CVA,  MI  FINAL ASSESSMENT AND PLAN  Dementia   Plan: The patient had presented for altered mental status in the setting of Alzheimer's dementia. Patient's labs are reassuring. Patient's imaging are reassuring.  No acute emergency medical condition was discovered.  This appears to be advancing dementia.  Family states they will discuss with her doctor about possible placement.   Ulice Dash, MD    Note: This note was generated in part or whole with voice recognition software. Voice recognition is usually quite accurate but there are transcription errors that can and very often do occur. I apologize for any typographical errors that were not detected and corrected.     Emily Filbert, MD 11/04/18 [phone]

## 2018-11-04 NOTE — ED Notes (Signed)
MD at bedside at this time.

## 2018-11-04 NOTE — ED Triage Notes (Signed)
PT here with family, states hx of dementia but has increased AMS x24hrs. Pt A&O to self, pt calm and cooperative

## 2018-12-05 ENCOUNTER — Telehealth: Payer: Self-pay

## 2018-12-05 NOTE — Telephone Encounter (Signed)
Message left to schedule palliative appt.

## 2018-12-10 ENCOUNTER — Telehealth: Payer: Self-pay | Admitting: Primary Care

## 2018-12-10 NOTE — Telephone Encounter (Signed)
Left message on only number in chart for patient. Palliative care attempting to schedule a home visit. Please return call.

## 2019-01-08 ENCOUNTER — Telehealth: Payer: Self-pay | Admitting: Primary Care

## 2019-01-08 NOTE — Telephone Encounter (Signed)
T/c to son to schedule palliative assessment. Son states he has found a care home for patient but is waiting until after the 14 day quarantine requirement to take her to her new home. Son also states his wife recently passed away from lung cancer 2 weeks after diagnosis, which is still a shock. He is now caring for his autistic son and mother. He states he does not really need palliative services now, but will keep number and reach back out if we are needed. We will notify referral source of declined services.

## 2019-01-15 ENCOUNTER — Emergency Department
Admission: EM | Admit: 2019-01-15 | Discharge: 2019-01-15 | Disposition: A | Discharge status: Home / Self Care | Payer: Medicare Other | Attending: Emergency Medicine | Admitting: Emergency Medicine

## 2019-01-15 ENCOUNTER — Encounter: Payer: Self-pay | Admitting: Emergency Medicine

## 2019-01-15 ENCOUNTER — Emergency Department: Payer: Medicare Other

## 2019-01-15 ENCOUNTER — Other Ambulatory Visit: Payer: Self-pay

## 2019-01-15 DIAGNOSIS — F0281 Dementia in other diseases classified elsewhere with behavioral disturbance: Secondary | ICD-10-CM | POA: Insufficient documentation

## 2019-01-15 DIAGNOSIS — Z79899 Other long term (current) drug therapy: Secondary | ICD-10-CM | POA: Diagnosis not present

## 2019-01-15 DIAGNOSIS — G309 Alzheimer's disease, unspecified: Secondary | ICD-10-CM | POA: Diagnosis not present

## 2019-01-15 DIAGNOSIS — Z1159 Encounter for screening for other viral diseases: Secondary | ICD-10-CM | POA: Diagnosis not present

## 2019-01-15 DIAGNOSIS — R4182 Altered mental status, unspecified: Secondary | ICD-10-CM | POA: Diagnosis present

## 2019-01-15 LAB — COMPREHENSIVE METABOLIC PANEL
ALT: 11 U/L (ref 0–44)
AST: 17 U/L (ref 15–41)
Albumin: 3.6 g/dL (ref 3.5–5.0)
Alkaline Phosphatase: 125 U/L (ref 38–126)
Anion gap: 6 (ref 5–15)
BUN: 7 mg/dL — ABNORMAL LOW (ref 8–23)
CO2: 25 mmol/L (ref 22–32)
Calcium: 8.9 mg/dL (ref 8.9–10.3)
Chloride: 104 mmol/L (ref 98–111)
Creatinine, Ser: 0.58 mg/dL (ref 0.44–1.00)
GFR calc Af Amer: >60 mL/min (ref >60)
GFR calc non Af Amer: >60 mL/min (ref >60)
Glucose, Bld: 105 mg/dL — ABNORMAL HIGH (ref 70–99)
Potassium: 3.8 mmol/L (ref 3.5–5.1)
Sodium: 135 mmol/L (ref 135–145)
Total Bilirubin: 0.5 mg/dL (ref 0.3–1.2)
Total Protein: 7.1 g/dL (ref 6.5–8.1)

## 2019-01-15 LAB — CBC WITH DIFFERENTIAL/PLATELET
Abs Immature Granulocytes: 0.02 10*3/uL (ref 0.00–0.07)
Basophils Absolute: 0.1 10*3/uL (ref 0.0–0.1)
Basophils Relative: 1 %
Eosinophils Absolute: 0.1 10*3/uL (ref 0.0–0.5)
Eosinophils Relative: 1 %
HCT: 34.5 % — ABNORMAL LOW (ref 36.0–46.0)
Hemoglobin: 11.4 g/dL — ABNORMAL LOW (ref 12.0–15.0)
Immature Granulocytes: 0 %
Lymphocytes Relative: 14 %
Lymphs Abs: 0.8 10*3/uL (ref 0.7–4.0)
MCH: 28.9 pg (ref 26.0–34.0)
MCHC: 33 g/dL (ref 30.0–36.0)
MCV: 87.6 fL (ref 80.0–100.0)
Monocytes Absolute: 0.7 10*3/uL (ref 0.1–1.0)
Monocytes Relative: 12 %
Neutro Abs: 4 10*3/uL (ref 1.7–7.7)
Neutrophils Relative %: 72 %
Platelets: 241 10*3/uL (ref 150–400)
RBC: 3.94 MIL/uL (ref 3.87–5.11)
RDW: 15.2 % (ref 11.5–15.5)
WBC: 5.6 10*3/uL (ref 4.0–10.5)
nRBC: 0 % (ref 0.0–0.2)

## 2019-01-15 LAB — URINALYSIS, COMPLETE (UACMP) WITH MICROSCOPIC
Bilirubin Urine: NEGATIVE
Glucose, UA: NEGATIVE mg/dL
Hgb urine dipstick: NEGATIVE
Ketones, ur: NEGATIVE mg/dL
Leukocytes,Ua: NEGATIVE
Nitrite: NEGATIVE
Protein, ur: NEGATIVE mg/dL
Specific Gravity, Urine: 1.009 (ref 1.005–1.030)
pH: 7 (ref 5.0–8.0)

## 2019-01-15 LAB — SARS CORONAVIRUS 2 BY RT PCR (HOSPITAL ORDER, PERFORMED IN ~~LOC~~ HOSPITAL LAB): SARS Coronavirus 2: NEGATIVE

## 2019-01-15 LAB — TROPONIN I: Troponin I: <0.03 ng/mL (ref <0.03)

## 2019-01-15 NOTE — Progress Notes (Signed)
New referral for outpatient Palliative to follow at home received from CSW [employer]. Plan is for discharge home with [facility]. Patient information faxed to referral. Thank you. Dayna Barker BSN, RN, [facility] [phone]

## 2019-01-15 NOTE — TOC Progression Note (Addendum)
Transition of Care [facility]) - Progression Note    Patient Details  Name: Mandy Reyes MRN: [id number] Date of Birth: December 07, 1940  [facility]) CM/SW Contact  Tania Alisson Rozell, LCSW Phone Number: 01/15/2019, 2:51 PM  Clinical Narrative:    2:36pm - CSW spoke with Junius Creamer at [facility], and stated that pt is negative for COVID-19, and asked if she will be able to admit her into the care home. Clydie Braun stated that she will speak to pt's son [name] directly to share what paperwork she will need.   2:50pm- CSW spoke with Karen's daughter that is currently working at [facility] [employer], and she was able to provide CSW with fax number so pt's clinicals can be sent.  Fax: [phone]  3:21pm - CSW faxed facesheet, FL2, COVID-19 test results, H&P, and PT notes to [facility].   Expected Discharge Plan: Home w Home Health Services Barriers to Discharge: Continued Medical Work up  Expected Discharge Plan and Services Expected Discharge Plan: Home w Home Health Services In-house Referral: Clinical Social Work   Post Acute Care Choice: Home Health Living arrangements for the past 2 months: Single Family Home                 DME Arranged: N/A DME Agency: NA       HH Arranged: RN, PT, Nurse's Aide, Social Work [employer] Agency: [facility]) Date HH Agency Contacted: 01/15/19 Time HH Agency Contacted: 1208 Representative spoke with at [facility] Agency: Feliberto Gottron    Social Determinants of Health (SDOH) Interventions    Readmission Risk Interventions No flowsheet data found.

## 2019-01-15 NOTE — ED Notes (Signed)
Patient transported to CT 

## 2019-01-15 NOTE — Clinical Social Work Note (Signed)
CSW attempted to reach [facility] [phone], since patient's son Mandy Reyes [phone] stated he has spoken to Junius Creamer regarding referral.  CSW had to leave a message awaiting for call back.  Ervin Knack. Nahun Kronberg, MSW, LCSW [phone]  01/15/2019 11:45 AM

## 2019-01-15 NOTE — TOC Initial Note (Addendum)
Transition of Care [facility](TOC) - Initial/Assessment Note    Patient Details  Name: Mandy Reyes MRN: [id number] Date of Birth: 02-27-1941  Transition of Care [facility](TOC) CM/SW Contact:    Darleene CleaverAnterhaus, Brady Schiller R, LCSW Phone Number: 01/15/2019, 12:34 PM  Clinical Narrative:    Patient is a 78 year old female who is alert and oriented x1.  Patient has some dementia and lives with her son.  Due to patient's confusion, CSW spoke with patient's son to complete assessment.  CSW was informed that patient was at [facility] for about 15 months, but the son reported that he took her out ALF, because he thought they overdosed patient.  Patient has now been living with the son for about 15 months, patient's son's assists with ADLs.  Patient's son states he can no longer continue taking care of patient.  Patient has a caregiver coming to the house 4 hours a day that he is paying for privately.  Patient normally is able to walk around the house with a walker, patient's son reported that patient has had an increase in confusion, and he is trying to get [facility] placed at [facility] because it is becoming too much for him to continue taking care of her.  Patient's son reported that he had spoken to Junius CreamerKaren Patterson from family care home on Monday 01/13/19, and she said patient needs to be on 14 day quarantine first before she is able to be accepted at family care home because of Covid 19.  Patient's son asked if there is a way to speed the process of getting patient in Family Care home, CSW said unfortunately there is not unless family care home will accept Covid 19 results.  Patient was tested for Covid 19, and she came back negative, CSW will fax results to family care home to see if the admission screen can be expedited.  Patient's information was submitted to Referral Screening Verification Process (RSVP), and patient's RSVP number is [phone].  CSW provided choice of home health agencies, patient's son  said who ever will be able to accept her insurance.  CSW made referral to [facility], and spoke with Barbara CowerJason, [facility] can [facility] patient, physician ordered home health PT, Nursing, Aide, and social worker.  Patient's son also asked for palliative to follow patient, and chose [facility].    2:15pm Patient's son reported to bedside nurse that he would just drop her off at the sheriff's department because he can not take care of her anymore, CSW decided to contact APS to report possible abandonment or neglect due to patient having Alzheimer's Dementia and patient's son expressed being very stressed and unable to continue taking care of patient.  CSW spoke with Luberta MutterKenneth Freeman from [facility].  3:25pm  CSW received an email from RSVP requesting clinical documentation to be faxed.  CSW faxed requested information to RSVP, patient also has a different RSVP now [phone].  Expected Discharge Plan: Home w Home Health Services Barriers to Discharge: Continued Medical Work up   Patient Goals and CMS Choice Patient states their goals for this hospitalization and ongoing recovery are:: To return back home while waiting for [facility] at [facility] CMS Medicare.gov Compare Post Acute Care list provided to:: Patient Represenative (must comment)(Patient has dementia) Choice offered to / list presented to : Adult Children  Expected Discharge Plan and Services Expected Discharge Plan: Home w Home Health Services In-house Referral: Clinical Social Work  Post Acute Care Choice: Home Health Living arrangements for the past 2 months: Single Family Home                 DME Arranged: N/A DME Agency: NA       HH Arranged: RN, PT, Nurse's Aide, Social Work [employer] Agency: [facility] Date HH Agency Contacted: 01/15/19 Time HH Agency Contacted: 1208 Representative spoke with at [facility] Agency: Feliberto Gottron   Prior Living Arrangements/Services Living  arrangements for the past 2 months: Single Family Home Lives with:: Adult Children Patient language and need for interpreter reviewed:: Yes Do you feel safe going back to the place where you live?: Yes(Patient's son is waiting for family care home to accept patient after 14 days)      Need for Family Participation in Patient Care: Yes (Comment) Care giver support system in place?: Yes (comment) Current home services: Other (comment)(Private Care Giver) Criminal Activity/Legal Involvement Pertinent to Current Situation/Hospitalization: No - Comment as needed  Activities of Daily Living      Permission Sought/Granted Permission sought to share information with : Case Manager, Family Supports Permission granted to share information with : Yes, Verbal Permission Granted  Share Information with NAME: Shaune Leeks [phone]  [phone]   Permission granted to share info w AGENCY: [facility]        Emotional Assessment Appearance:: Appears stated age   Affect (typically observed): Accepting, Pleasant, Stable, Calm Orientation: : Oriented to Self Alcohol / Substance Use: Not Applicable Psych Involvement: No (comment)  Admission diagnosis:  confused dizziness There are no active problems to display for this patient.  PCP:  [facility] [employer] [facility] Pharmacy:   [facility]  [address] [place] [address] Phone: [phone] Fax: [phone]     Social Determinants of Health (SDOH) Interventions    Readmission Risk Interventions No flowsheet data found.

## 2019-01-15 NOTE — ED Provider Notes (Signed)
[facility] Emergency Department Provider Note   ____________________________________________    I have reviewed the triage vital signs and the nursing notes.   HISTORY  Chief Complaint Altered Mental Status   Patient with history of Alzheimer's dementia, history is primarily per son  HPI Mandy Reyes is a 78 y.o. female with a history of Alzheimer's dementia who is here with her son.  He reports over the last several days he has had difficulty caring for her.  He reports a general decline over the last 3 to 4 months.  Most recently she seems to have developed balance issues as well as behavior issues.  No reports of fever cough diarrhea or vomiting.  He has been discussing with various care homes  Past Medical History:  Diagnosis Date  . Alzheimer's dementia (HCC)     There are no active problems to display for this patient.   History reviewed. No pertinent surgical history.  Prior to Admission medications   Medication Sig Start Date End Date Taking? Authorizing Provider  levothyroxine (SYNTHROID, LEVOTHROID) 50 MCG tablet Take 1 tablet by mouth daily. 06/19/13  Yes [provider]  risperiDONE (RISPERDAL) 0.5 MG tablet Take 0.5 mg by mouth 2 (two) times daily.   Yes [provider]  sertraline (ZOLOFT) 50 MG tablet Take 75 mg by mouth daily. 05/11/16  Yes [provider]  vitamin B-12 (CYANOCOBALAMIN) 1000 MCG tablet Take 1 tablet by mouth daily.   Yes [provider]     Allergies Patient has no known allergies.  History reviewed. No pertinent family history.  Social History Social History   Tobacco Use  . Smoking status: Not on file  Substance Use Topics  . Alcohol use: No    Frequency: Never  . Drug use: No    Review of Systems limited by dementia     ____________________________________________   PHYSICAL EXAM:  VITAL SIGNS: ED Triage Vitals  Enc Vitals Group     BP 01/15/19 0912  100/61     Pulse Rate 01/15/19 0912 77     Resp 01/15/19 0912 18     Temp 01/15/19 0916 97.6 F (36.4 C)     Temp Source 01/15/19 0912 Oral     SpO2 01/15/19 0912 96 %     Weight 01/15/19 0911 68 kg (150 lb)     Height 01/15/19 0911 1.549 m (5\' 1" )     Head Circumference --      Peak Flow --      Pain Score --      Pain Loc --      Pain Edu? --      Excl. in GC? --     Constitutional: Alert  Eyes: Conjunctivae are normal.  Head: Atraumatic. Nose: No congestion/rhinnorhea.  Cardiovascular: Normal rate, regular rhythm. Grossly normal heart sounds.  Good peripheral circulation. Respiratory: Normal respiratory effort.  No retractions. Lungs CTAB. Gastrointestinal: Soft and nontender. No distention.   Musculoskeletal: No lower extremity tenderness nor edema.  Warm and well perfused Neurologic:  Normal speech and language. No gross focal neurologic deficits are appreciated.  Skin:  Skin is warm, dry and intact.  Bruising to the right wrist Psychiatric: Patient is calm and quiet  ____________________________________________   LABS (all labs ordered are listed, but only abnormal results are displayed)  Labs Reviewed  CBC WITH DIFFERENTIAL/PLATELET - Abnormal; Notable for the following components:      Result Value   Hemoglobin 11.4 (*)  HCT 34.5 (*)    All other components within normal limits  COMPREHENSIVE METABOLIC PANEL - Abnormal; Notable for the following components:   Glucose, Bld 105 (*)    BUN 7 (*)    All other components within normal limits  URINALYSIS, COMPLETE (UACMP) WITH MICROSCOPIC - Abnormal; Notable for the following components:   Color, Urine YELLOW (*)    APPearance CLEAR (*)    Bacteria, UA RARE (*)    All other components within normal limits  SARS CORONAVIRUS 2 (HOSPITAL ORDER, PERFORMED IN [facility])  TROPONIN I    ____________________________________________  EKG  None ____________________________________________  RADIOLOGY  CT head no acute distress Chest x-ray unremarkable ____________________________________________   PROCEDURES  Procedure(s) performed: No  Procedures   Critical Care performed: No ____________________________________________   INITIAL IMPRESSION / ASSESSMENT AND PLAN / ED COURSE  Pertinent labs & imaging results that were available during my care of the patient were reviewed by me and considered in my medical decision making (see chart for details).  Patient here with what sounds like gradual decline of Alzheimer's dementia, family unable to care for this patient this time.  We will check labs to evaluate for possible infection or other cause of recent changes in balance.  Will consult PT and social work as well.  Work-up thus far is overall unremarkable.  Patient seen by social work, home health has been arranged    ____________________________________________   FINAL CLINICAL IMPRESSION(S) / ED DIAGNOSES  Final diagnoses:  Alzheimer's dementia with behavioral disturbance, unspecified timing of dementia onset (HCC)        Note:  This document was prepared using Dragon voice recognition software and may include unintentional dictation errors.   [name], Deverick Pruss, MD 01/15/19 1409

## 2019-01-15 NOTE — ED Notes (Signed)
Physical therapy at bedside

## 2019-01-15 NOTE — [place] FL2 (Signed)
  [place] MEDICAID FL2 LEVEL OF CARE SCREENING TOOL     IDENTIFICATION  Patient Name: Mandy Reyes Birthdate: 04/08/1941 Sex: female Admission Date (Current Location): 01/15/2019  [place] Number:  Chiropodist and Address:  [facility], [address] [place], [place] [address]      Provider Number: [id number]  Attending Physician Name and Address:  Jene Every, MD Relative Name and Phone Number:  Shaune Leeks [phone]  [phone]     Current Level of Care: Hospital Recommended Level of Care: Family Care Home, Assisted Living Facility, Memory Care Prior Approval Number:    Date Approved/Denied:   PASRR Number:    Discharge Plan: Other (Comment)(Family Care Home)    Current Diagnoses: There are no active problems to display for this patient.   Orientation RESPIRATION BLADDER Height & Weight     Self  Normal Incontinent Weight: 150 lb (68 kg) Height:  5\' 1"  (154.9 cm)  BEHAVIORAL SYMPTOMS/MOOD NEUROLOGICAL BOWEL NUTRITION STATUS      Continent Diet(Regular Diet)  AMBULATORY STATUS COMMUNICATION OF NEEDS Skin   Supervision Verbally Normal                       Personal Care Assistance Level of Assistance  Bathing, Feeding, Dressing Bathing Assistance: Limited assistance Feeding assistance: Limited assistance Dressing Assistance: Limited assistance     Functional Limitations Info  Sight, Hearing, Speech Sight Info: Adequate Hearing Info: Adequate Speech Info: Adequate    SPECIAL CARE FACTORS FREQUENCY  PT (By licensed PT)     PT Frequency: Minimum 2x a week for home health              Contractures Contractures Info: Not present    Additional Factors Info  Psychotropic, Code Status, Allergies Code Status Info: DNR Allergies Info: NKA Psychotropic Info: risperiDONE (RISPERDAL) 0.5 MG tablet and sertraline (ZOLOFT) 50 MG tablet         Current Medications (01/15/2019):  This is  the current hospital active medication list No current facility-administered medications for this encounter.    Current Outpatient Medications  Medication Sig Dispense Refill  . levothyroxine (SYNTHROID, LEVOTHROID) 50 MCG tablet Take 1 tablet by mouth daily.    . risperiDONE (RISPERDAL) 0.5 MG tablet Take 0.5 mg by mouth 2 (two) times daily.    . sertraline (ZOLOFT) 50 MG tablet Take 75 mg by mouth daily.    . vitamin B-12 (CYANOCOBALAMIN) 1000 MCG tablet Take 1 tablet by mouth daily.       Discharge Medications: Please see discharge summary for a list of discharge medications.  Relevant Imaging Results:  Relevant Lab Results:   Additional Information SSN [id number] RSVP [id number]  Darleene Cleaver, LCSW

## 2019-01-15 NOTE — ED Triage Notes (Signed)
Pt presents to ED via POV with family member who brought pt in for increased confusion and new combativeness at home. Pt has hx alzheimer's dementia, family state pt is typically only able to answer some yes or no questions but now is not able to answer any and has been fighting with care at home.

## 2019-01-15 NOTE — ED Notes (Signed)
Social work at bedside.  
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
## 2019-01-15 NOTE — Evaluation (Signed)
Physical Therapy Evaluation Patient Details Name: Mandy Reyes MRN: [id number] DOB: Jan 31, 1941 Today's Date: 01/15/2019   History of Present Illness  Mandy Reyes is a 78 y.o. female with a history of Alzheimer's dementia who is here with her son.  He reports over the last several days he has had difficulty caring for her.  He reports a general decline over the last 3 to 4 months.  Most recently she seems to have developed balance issues as well as behavior issues.  No reports of fever cough diarrhea or vomiting.  He has been discussing with various care homes  Clinical Impression  Patient demonstrates modI with basic transfers and bed mobility with some difficulty sequencing d/t confusion, but is able to comply with cuing following increased time. Patient requires some cuing and encouragement to initiate stepping, but is able to ambulate 17500ft with RW with cuing for set up and safe negotiation of RW with walking, and is able to ambulate 6350ft with slow shuffle steps. PT cued patient for increased foot clearance and stride length to decrease fall risk which she is unable to comply with. Pt demonstrates fall risk d/t inadequate foot clearance and confusion of initiation and sequencing transfers and ambulation. PT recommends 24hour care, but realizes pt son is at high risk for caregiver burnout d/t multiple other life factors. If 24 hour support for son is not available pt would be suitable for SNF placement d/t safety concerns. Would benefit from skilled PT to address above deficits and promote optimal return to PLOF     Follow Up Recommendations Supervision/Assistance - 24 hour    Equipment Recommendations  Rolling walker with 5" wheels    Recommendations for Other Services Rehab consult     Precautions / Restrictions Precautions Precautions: Fall      Mobility  Bed Mobility Overal bed mobility: Modified Independent             General bed mobility comments: increased time  needed to complete supine <> sit; difficulty sequencing scoot up in bed/rolling but able to do so with cuing without physical assistance  Transfers Overall transfer level: Modified independent Equipment used: Rolling walker (2 wheeled) Transfers: Sit to/from Stand Sit to Stand: Min guard         General transfer comment: Patient requires hand placement for proper set up with RW with patient able to stand without physical assist, only min gaurd for safety   Ambulation/Gait Ambulation/Gait assistance: Supervision;Min guard Gait Distance (Feet): 150 Feet Assistive device: Rolling walker (2 wheeled);None Gait Pattern/deviations: Step-through pattern;Decreased stride length Gait velocity: decreased   General Gait Details: Patient able to ambulate supervision with RW; CGA without AD. PT gives patient cuing to increase step length and foot clearance, which patient is unable to comply with  Stairs            Wheelchair Mobility    Modified Rankin (Stroke Patients Only)       Balance Overall balance assessment: Needs assistance Sitting-balance support: No upper extremity supported;Feet unsupported Sitting balance-Leahy Scale: Good     Standing balance support: During functional activity Standing balance-Leahy Scale: Fair                               Pertinent Vitals/Pain Pain Assessment: No/denies pain    Home Living Family/patient expects to be discharged to:: Private residence Living Arrangements: Children Available Help at Discharge: Family Type of Home: House Home Access: Stairs  to enter Entrance Stairs-Rails: Left Entrance Stairs-Number of Steps: 5 Home Layout: One level Home Equipment: Walker - 2 wheels;Cane - single point;Shower seat Additional Comments: Son lives with patient and reports he helps her with all ADLs. Reports she can feed herself "messily" but that he assists with dressing/bathing, cooks all meals and does all cleaning. Reports  that she has a cane and RW at home, but does not     Prior Function Level of Independence: Needs assistance   Gait / Transfers Assistance Needed: CGA/supervision for transfers and gait in house. Reports she is able to ambulate up stairs to enter home with handrail and gaurding, but that he nearly "has to carry her down the steps  ADL's / Homemaking Assistance Needed: Son performs bathing, dressing, cooking, cleaning, and helps with feeding        Hand Dominance   Dominant Hand: Right    Extremity/Trunk Assessment                Communication   Communication: No difficulties  Cognition Arousal/Alertness: Awake/alert Behavior During Therapy: WFL for tasks assessed/performed Overall Cognitive Status: History of cognitive impairments - at baseline                                 General Comments: Pleasantly confused through session. Pt reports "I'm a salad" and that she "does not know how to walk" initially but is able to perform steps      General Comments      Exercises Other Exercises Other Exercises: Patient able to demonstrate modI bed mobility with increased time needed, cuing needed for sequencing of rolling and scooting d/t confusion but able to complete Other Exercises: STS with RW with hand placement and patient able to complete following set up supervision Other Exercises: AMB initially patietn reports she does not understand how to step forward. Following PT cuing, patient is able to start sequencing gait, walk with RW supervision 120ft and CGA without AD over 38ft. cuing throughout to increase stride length and foot clearance with little compliance with cuing.    Assessment/Plan    PT Assessment Patient needs continued PT services  PT Problem List Decreased coordination;Decreased activity tolerance;Decreased balance;Decreased mobility;Decreased knowledge of use of DME       PT Treatment Interventions DME instruction;Therapeutic exercise;Gait  training;Balance training;Manual techniques;Stair training;Neuromuscular re-education;Functional mobility training;Therapeutic activities;Patient/family education    PT Goals (Current goals can be found in the Care Plan section)  Acute Rehab PT Goals Patient Stated Goal: Return home  PT Goal Formulation: With patient Time For Goal Achievement: 01/29/19 Potential to Achieve Goals: Fair    Frequency Min 2X/week   Barriers to discharge   Son is primary caregiver and is at risk of caregiver burnout d/t his wife passing 2weeks ago, being caregiver for autistic son, and continuous monitoring pt requires for safety    Co-evaluation               AM-PAC PT "6 Clicks" Mobility  Outcome Measure Help needed turning from your back to your side while in a flat bed without using bedrails?: A Little Help needed moving from lying on your back to sitting on the side of a flat bed without using bedrails?: A Little Help needed moving to and from a bed to a chair (including a wheelchair)?: A Little Help needed standing up from a chair using your arms (e.g., wheelchair or bedside chair)?: A Little  Help needed to walk in hospital room?: A Little Help needed climbing 3-5 steps with a railing? : A Lot 6 Click Score: 17    End of Session Equipment Utilized During Treatment: Gait belt Activity Tolerance: Patient tolerated treatment well Patient left: in bed;with family/visitor present;with call bell/phone within reach Nurse Communication: Mobility status PT Visit Diagnosis: Unsteadiness on feet (R26.81);Other abnormalities of gait and mobility (R26.89);Muscle weakness (generalized) (M62.81);History of falling (Z91.81)    Time: 9604-5409 PT Time Calculation (min) (ACUTE ONLY): 24 min   Charges:     PT Treatments $Gait Training: 23-37 mins $Therapeutic Activity: 23-37 mins        Staci Acosta PT, DPT  Staci Acosta 01/15/2019, 1:37 PM

## 2019-02-14 IMAGING — CT CT HEAD W/O CM
3 series · 15 of 46 positions shown, 18 images · non-contrast
Comparison: None.

CLINICAL DATA: Dementia, lethargy, altered level consciousness

EXAM:
CT HEAD WITHOUT CONTRAST
TECHNIQUE: Contiguous axial images were obtained from the base of the skull
through the vertex without intravenous contrast.

[Series 2: head wo · axial · 0.41mm/px · z∈[-175,-55]mm · 9 of 29 slices shown, 12 images]
[im 3/29  brain]
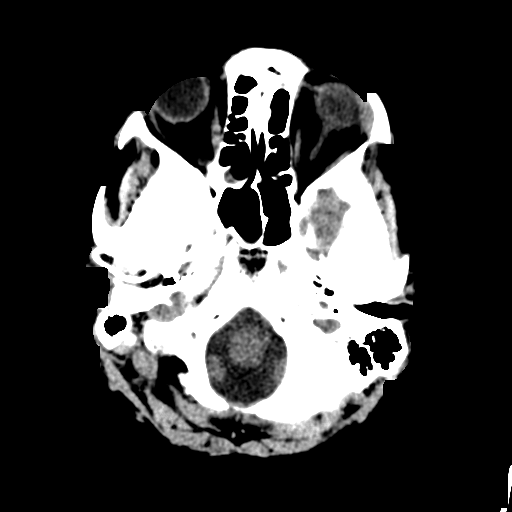
[im 3/29  bone]
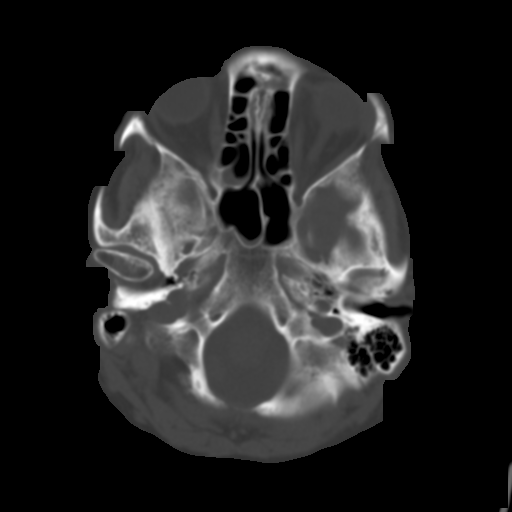
[im 6/29  brain]
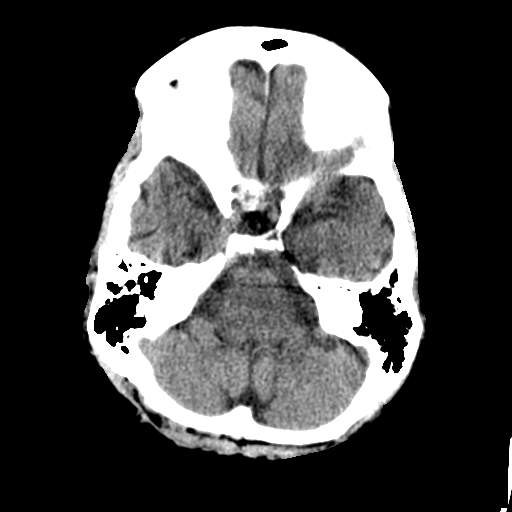
[im 9/29  brain]
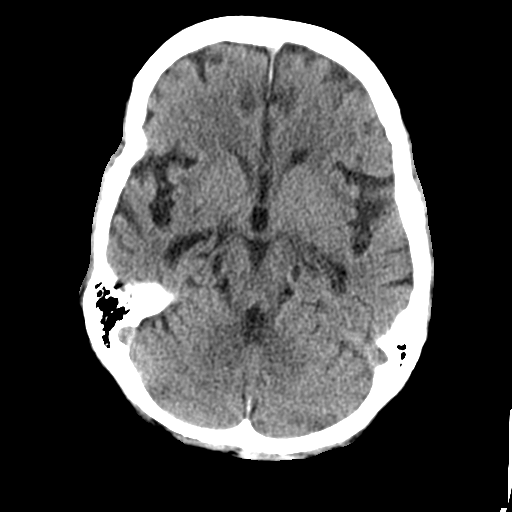
[im 12/29  brain]
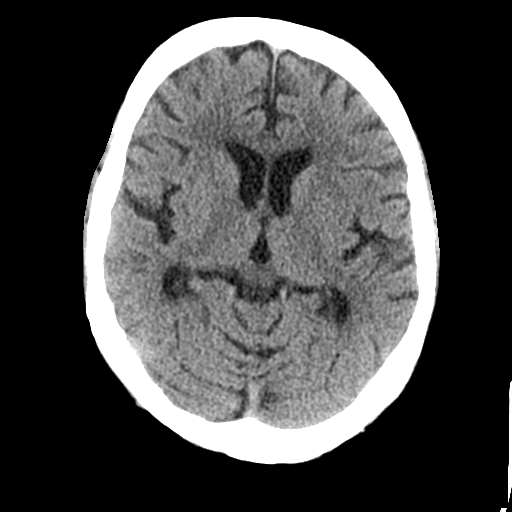
[im 15/29  brain]
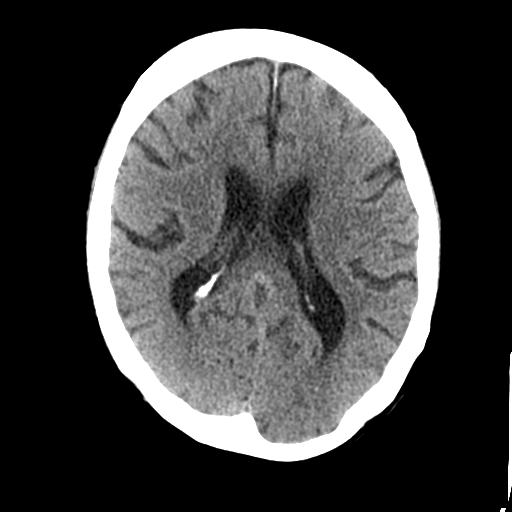
[im 15/29  bone]
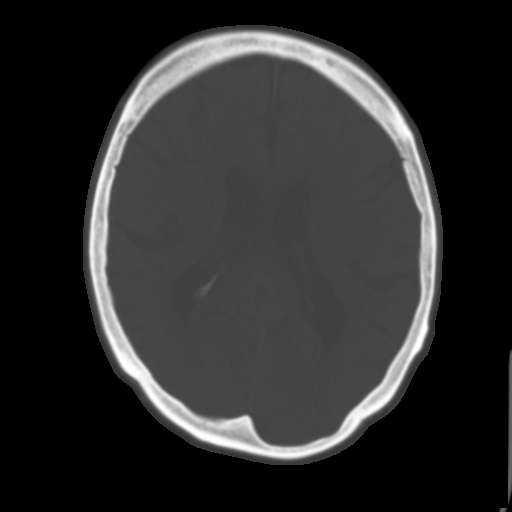
[im 18/29  brain]
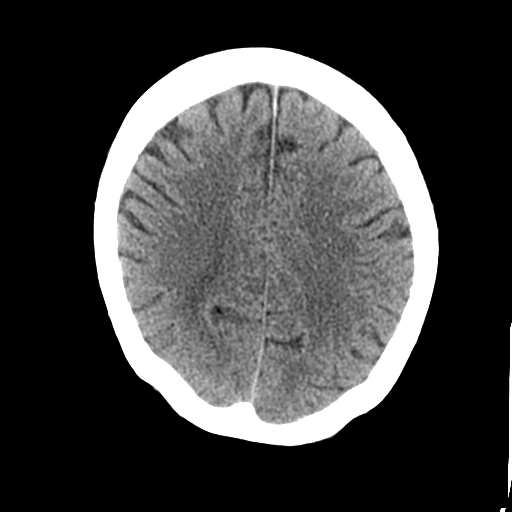
[im 21/29  brain]
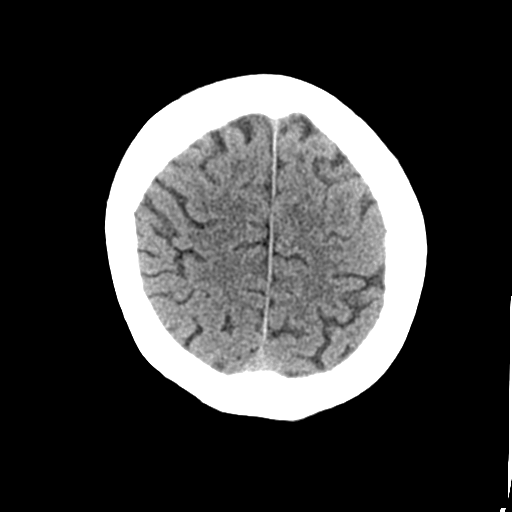
[im 24/29  brain]
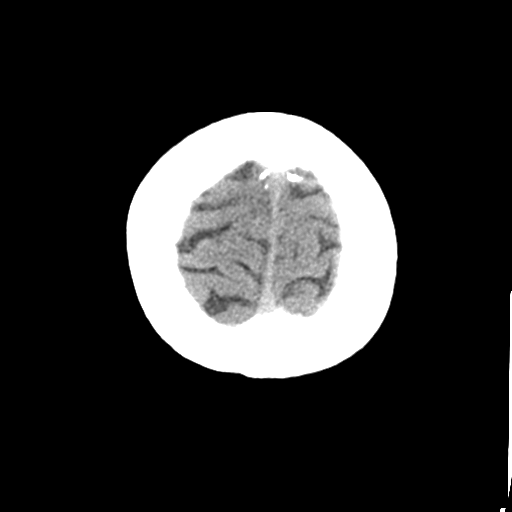
[im 27/29  brain]
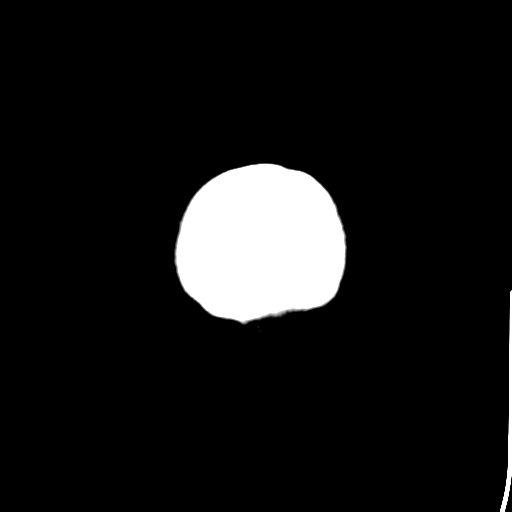
[im 27/29  bone]
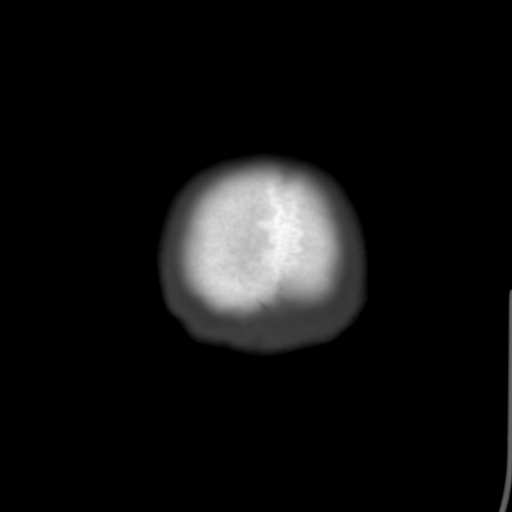

[Series 4: coronal soft tissue · coronal · 0.31mm/px · 3 of 61 slices shown]
[im 21/61  brain]
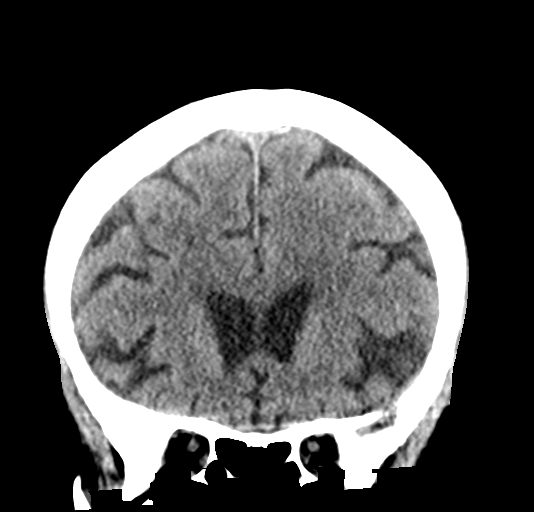
[im 27/61  brain]
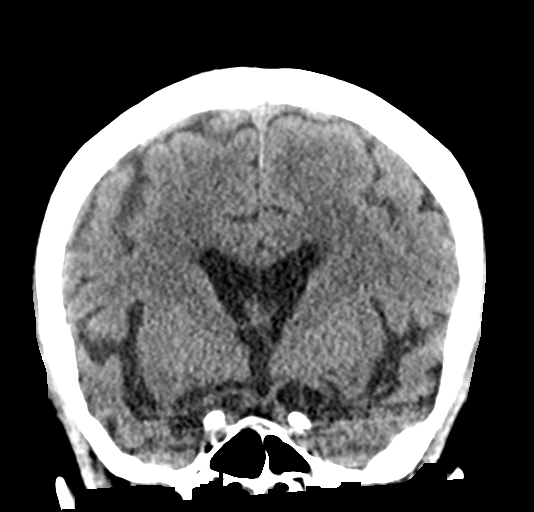
[im 34/61  brain]
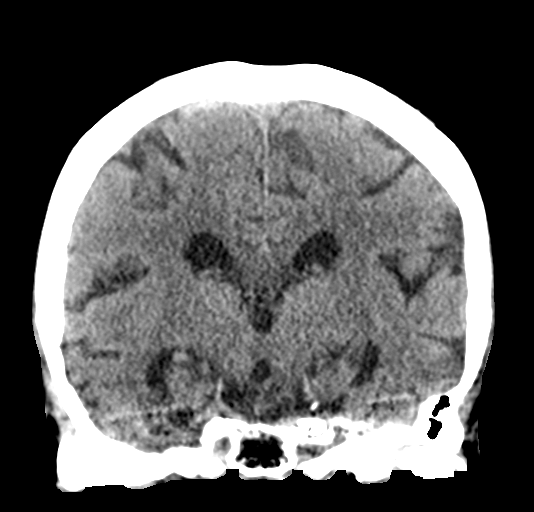

[Series 5: sagittal soft tissue · sagittal · 0.31mm/px · 3 of 52 slices shown]
[im 18/52  brain]
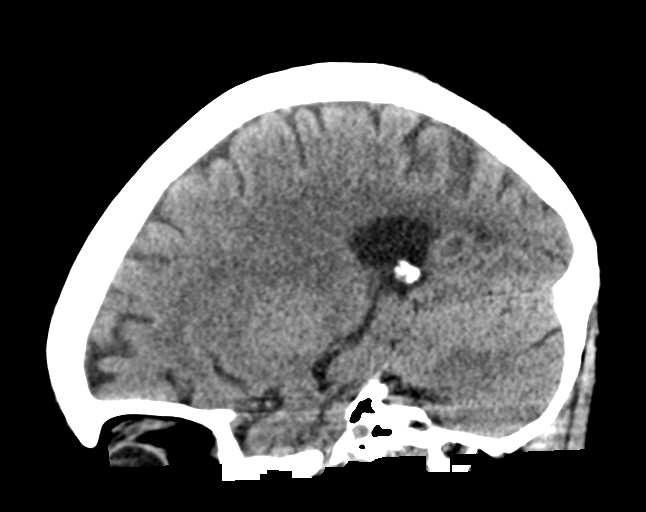
[im 26/52  brain]
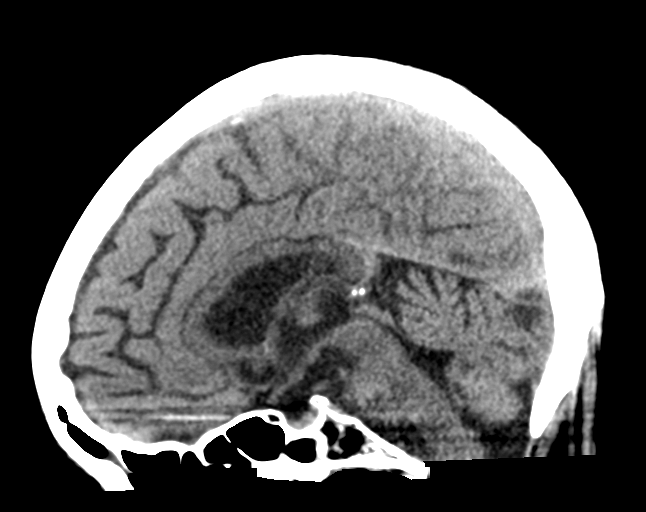
[im 35/52  brain]
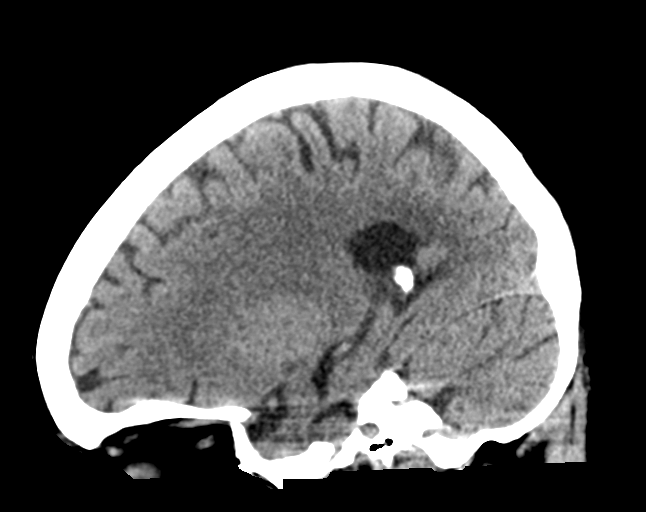

[15 of 46 positions shown; findings below may reference images not displayed]

FINDINGS: Brain: Mild brain atrophy pattern and chronic white matter
microvascular changes bilaterally about the lateral ventricles. No
acute intracranial hemorrhage infarction, mass lesion shift
herniation, hydrocephalus extra-axial collection. No focal mass
effect or edema. Cisterns are patent. Cerebellar atrophy as well.

Vascular: No hyperdense vessel or unexpected calcification.

Skull: Normal. Negative for fracture or focal lesion.

Sinuses/Orbits: Orbits unremarkable. Minor sinus mucosal thickening
without significant opacification or air-fluid level. Mastoids are
clear.

Other: None.
IMPRESSION: Mild brain atrophy and chronic white matter microvascular ischemic
changes.

No acute intracranial abnormality by noncontrast CT.

## 2019-05-31 IMAGING — CR DG CHEST 2V
2 series · 2 of 2 positions shown · non-contrast
Comparison: Limited comparison made with CT abdomen 01/04/2017.

CLINICAL DATA: Near syncopal episode. No fall. Alzheimer's disease.

EXAM:
CHEST - 2 VIEW

[chest lat]
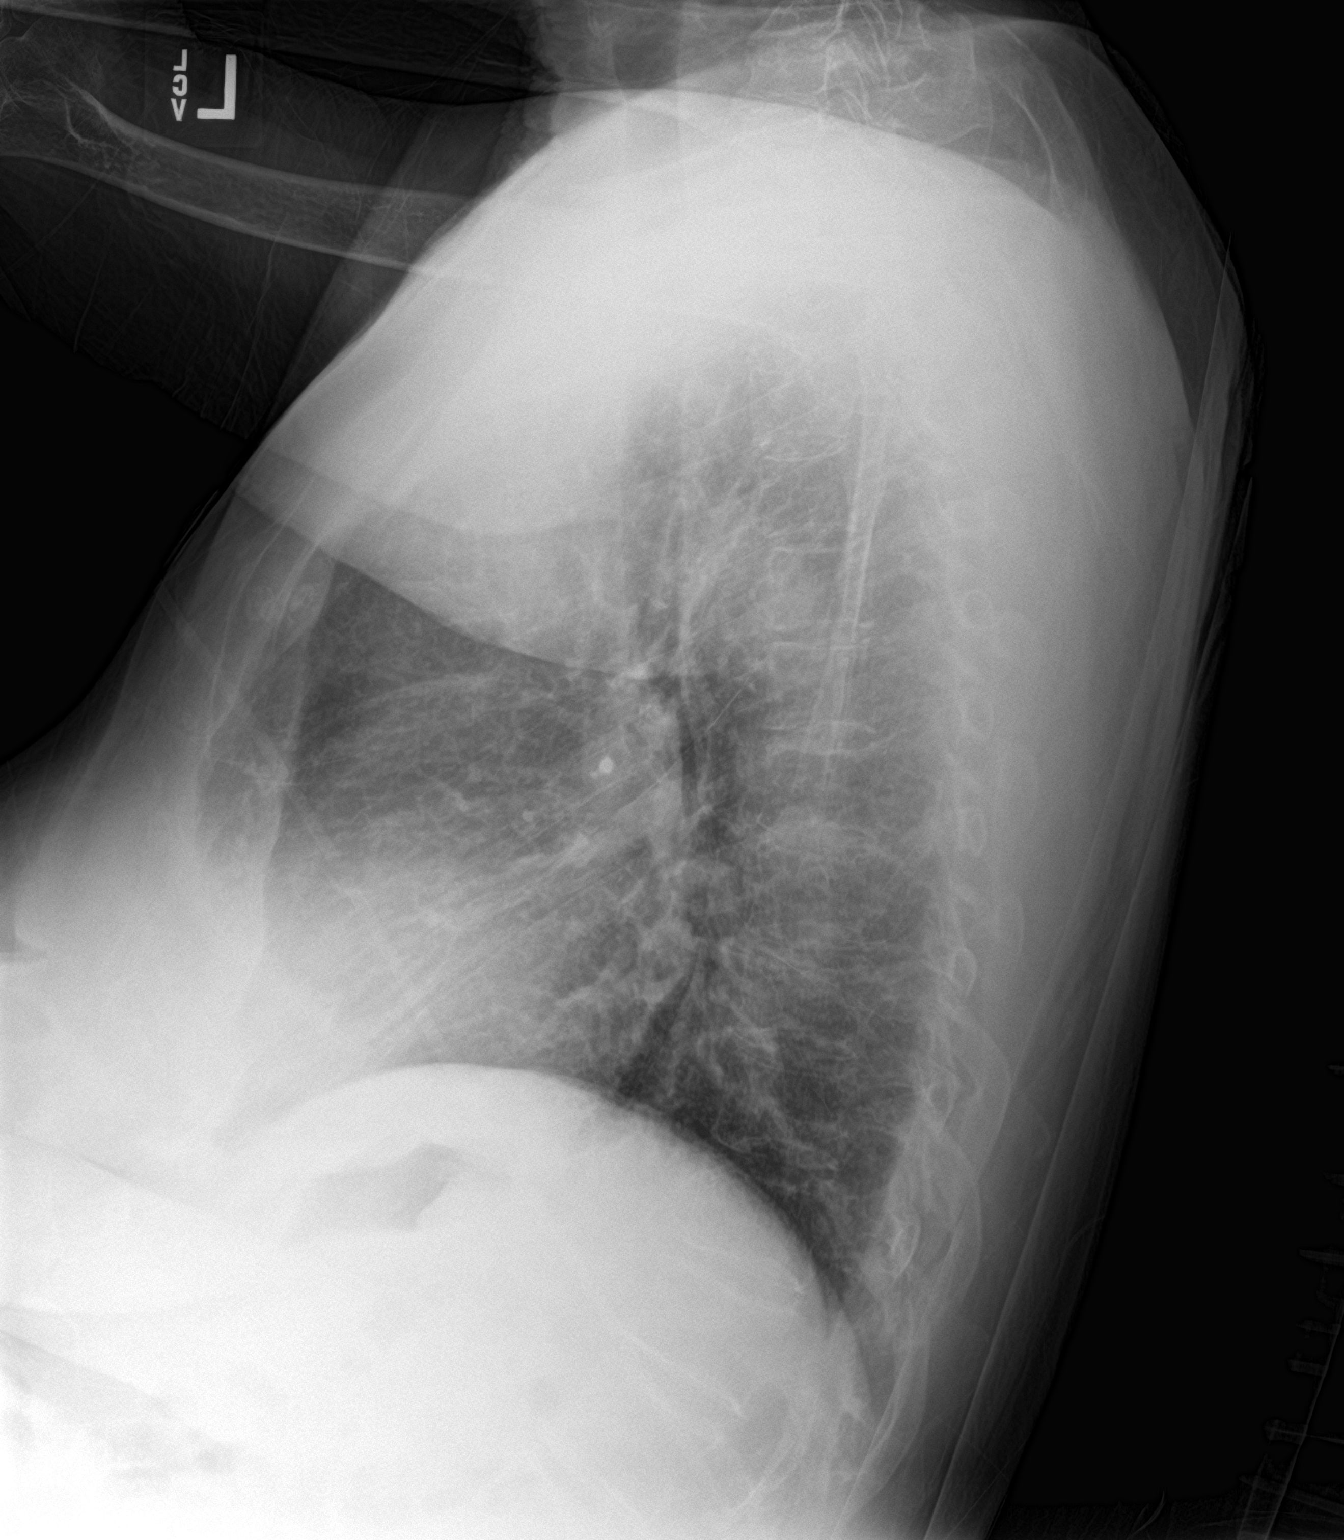

[chest ap]
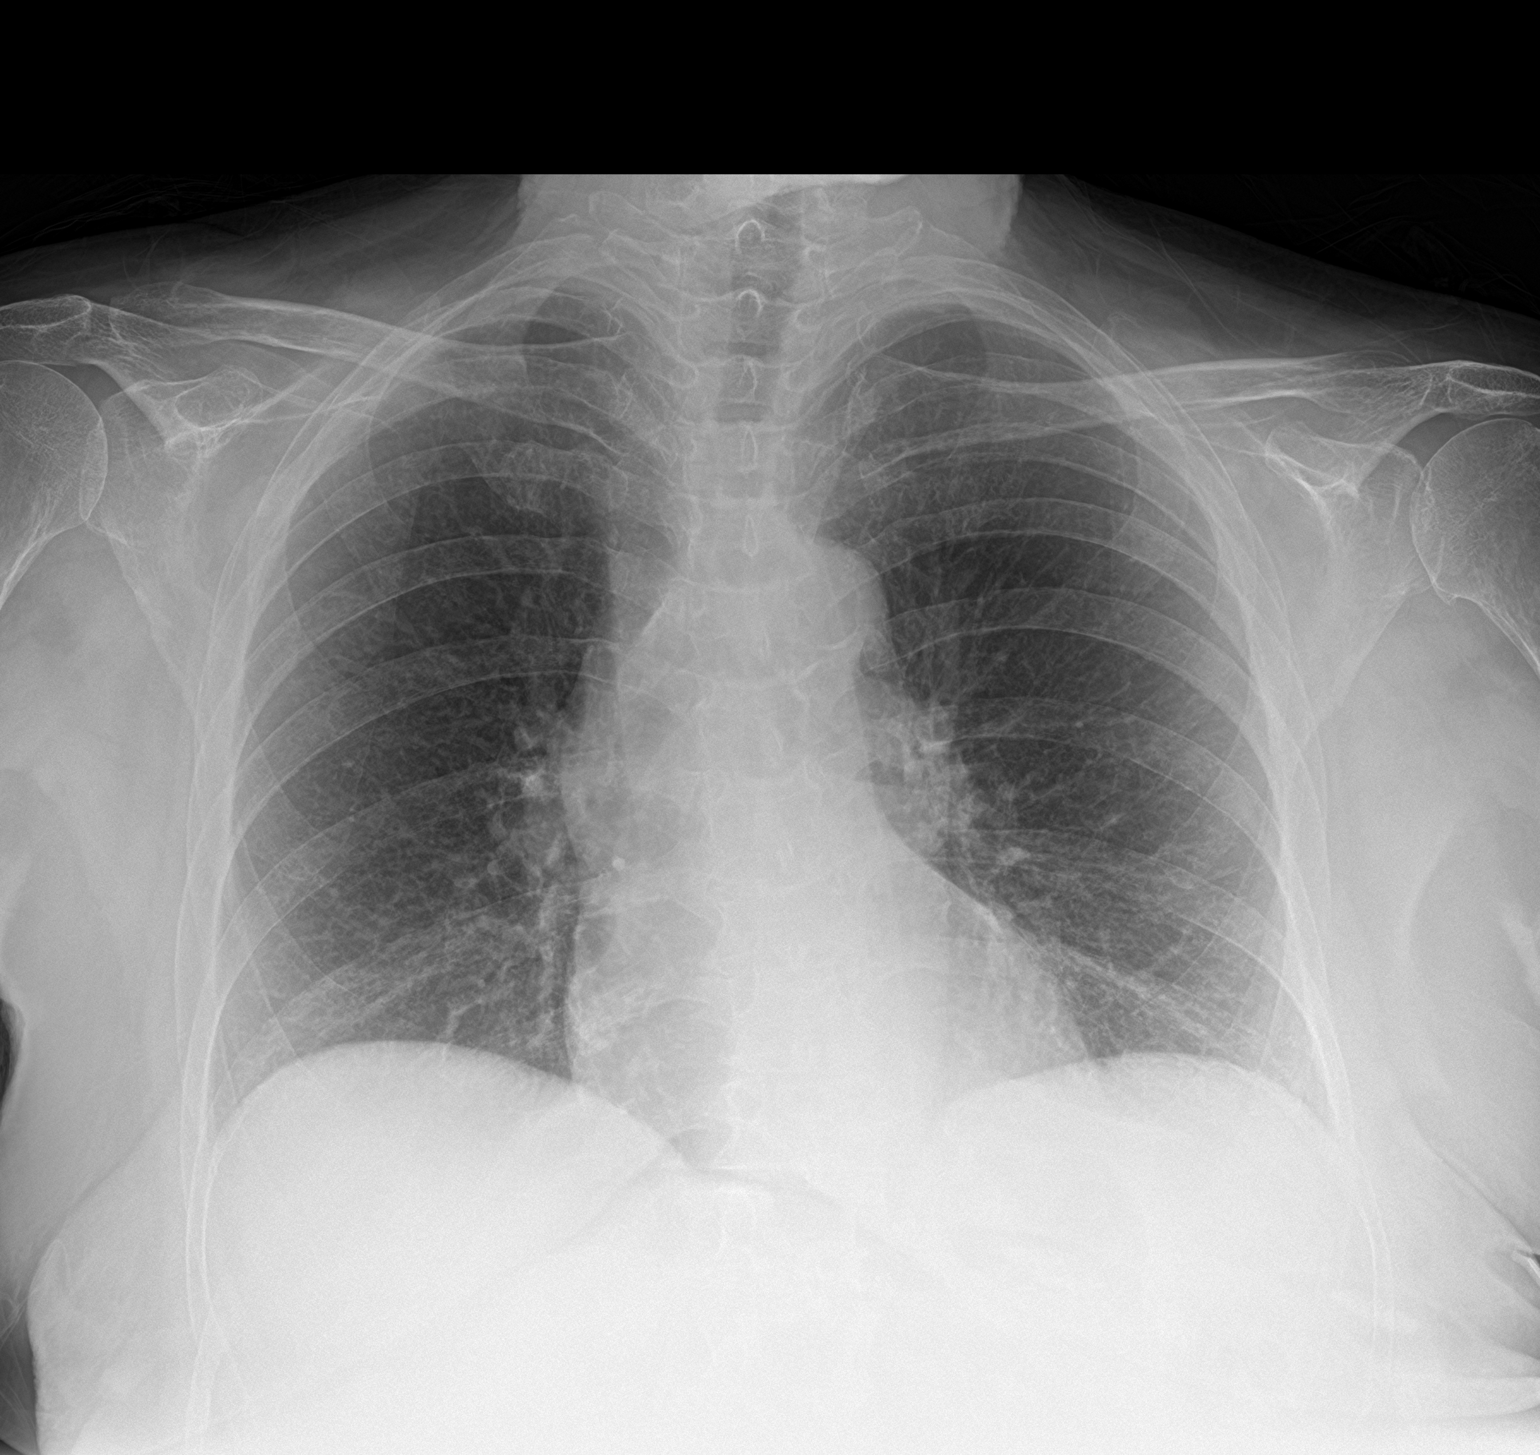

[2 of 2 positions shown; findings below may reference images not displayed]

FINDINGS: The heart size and mediastinal contours are normal. There is mild
aortic atherosclerosis. The lungs are clear. There is no pleural
effusion or pneumothorax. No acute osseous findings are seen. There
are mild degenerative changes throughout the spine.
IMPRESSION: No active cardiopulmonary process.

## 2020-03-29 IMAGING — CR DG CHEST 2V
1 series · 2 of 2 positions shown · non-contrast
Comparison: 01/05/2018

CLINICAL DATA: History of dementia.  Increased AMS for 24 hours.

EXAM:
CHEST - 2 VIEW

[Series 1: dg chest 2 view · 0.14mm/px · 2 of 2 slices shown]
[im 1/2]
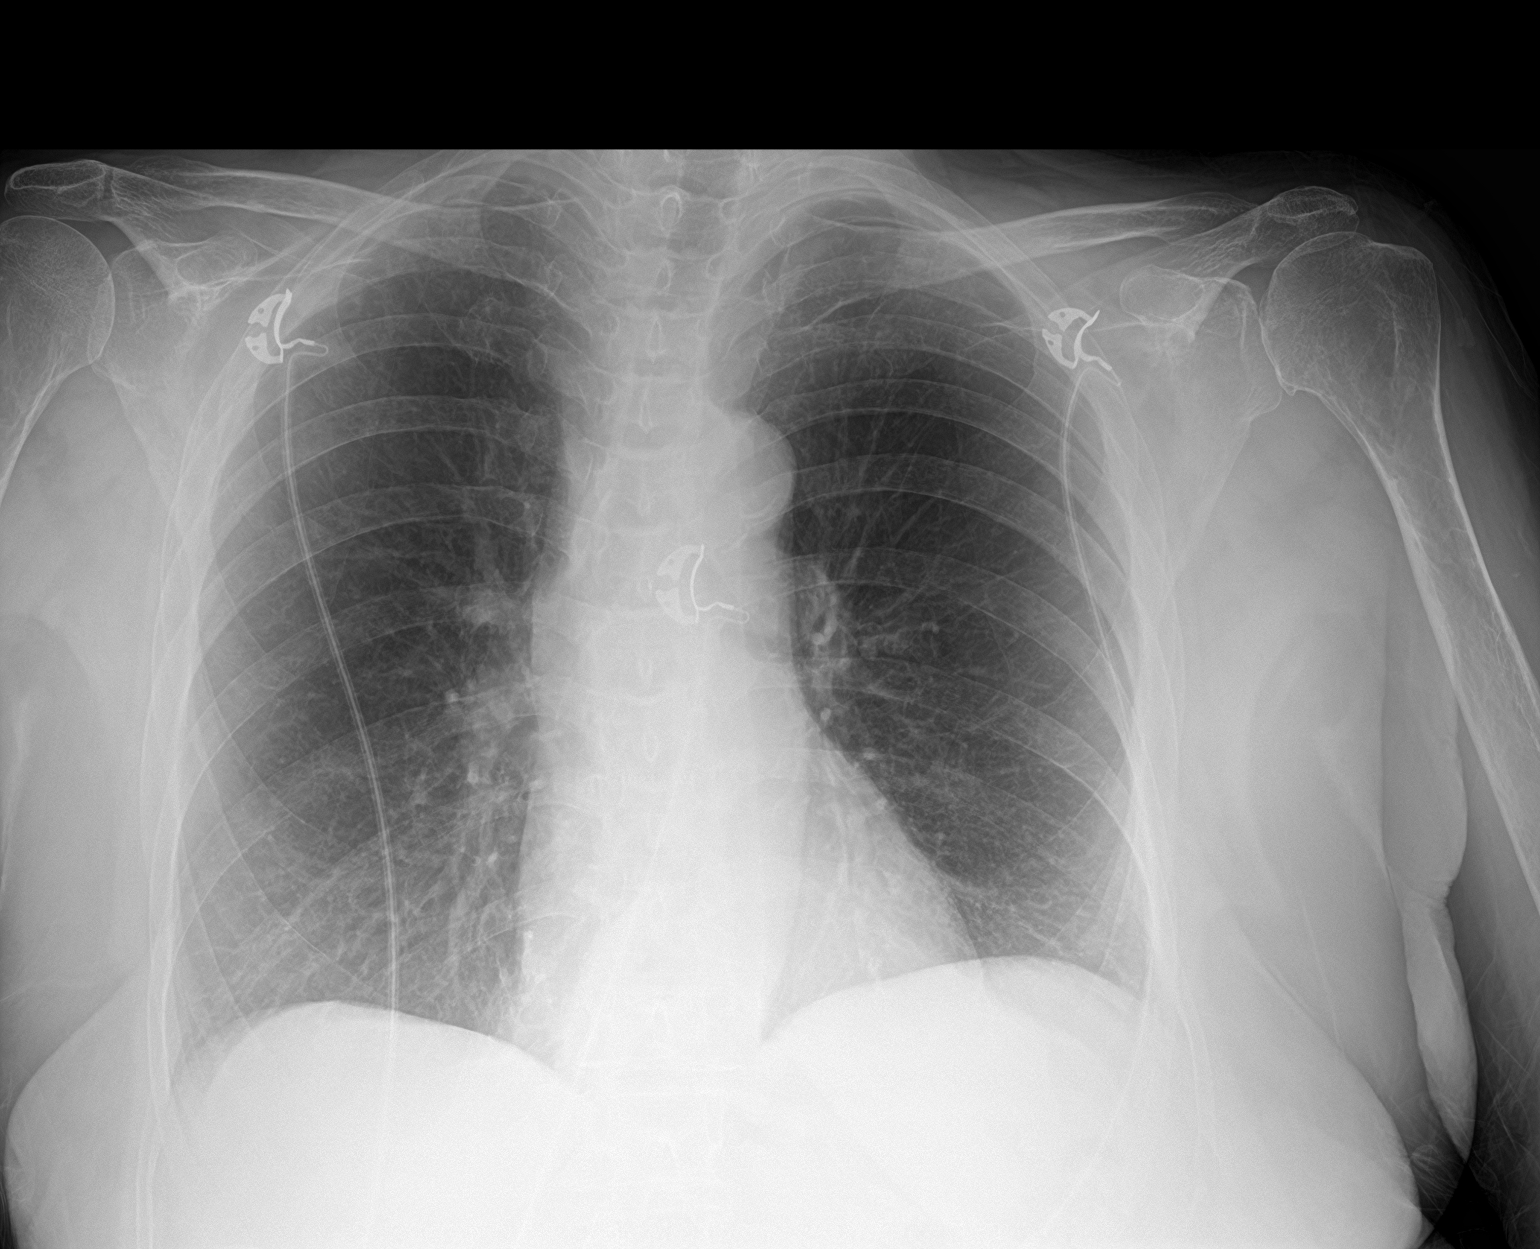
[im 2/2]
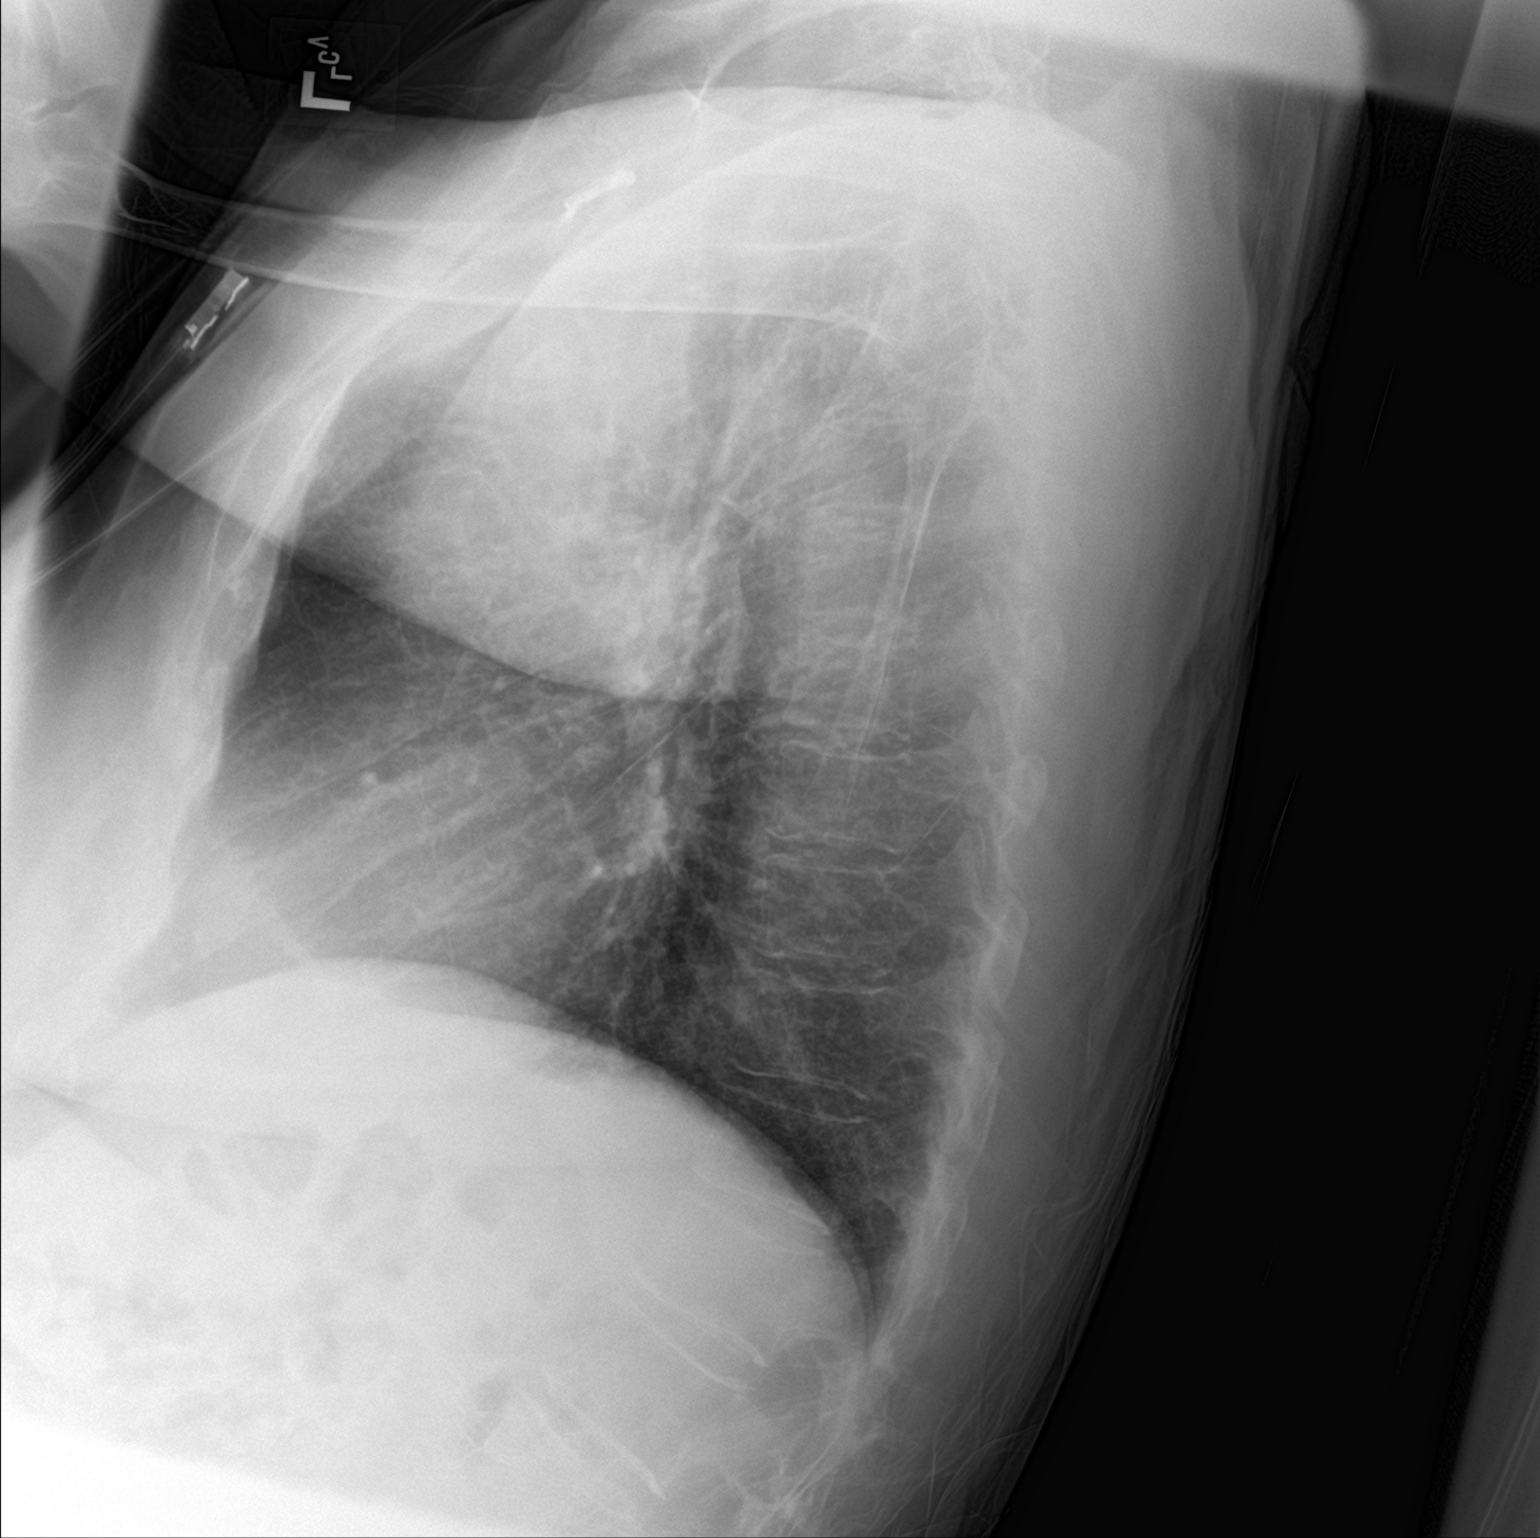

[2 of 2 positions shown; findings below may reference images not displayed]

FINDINGS: The heart size and mediastinal contours are within normal limits.
Both lungs are clear. The visualized skeletal structures are
unremarkable.
IMPRESSION: No active cardiopulmonary disease.

## 2020-04-05 ENCOUNTER — Other Ambulatory Visit: Payer: Self-pay

## 2020-04-05 ENCOUNTER — Emergency Department
Admission: EM | Admit: 2020-04-05 | Discharge: 2020-04-05 | Disposition: A | Discharge status: Home / Self Care | Payer: Medicare Other | Attending: Emergency Medicine | Admitting: Emergency Medicine

## 2020-04-05 ENCOUNTER — Encounter: Payer: Self-pay | Admitting: Emergency Medicine

## 2020-04-05 DIAGNOSIS — N390 Urinary tract infection, site not specified: Secondary | ICD-10-CM | POA: Insufficient documentation

## 2020-04-05 DIAGNOSIS — R4182 Altered mental status, unspecified: Secondary | ICD-10-CM | POA: Insufficient documentation

## 2020-04-05 DIAGNOSIS — F0281 Dementia in other diseases classified elsewhere with behavioral disturbance: Secondary | ICD-10-CM | POA: Diagnosis not present

## 2020-04-05 DIAGNOSIS — G309 Alzheimer's disease, unspecified: Secondary | ICD-10-CM | POA: Diagnosis not present

## 2020-04-05 DIAGNOSIS — Z87891 Personal history of nicotine dependence: Secondary | ICD-10-CM | POA: Diagnosis not present

## 2020-04-05 LAB — COMPREHENSIVE METABOLIC PANEL
ALT: 14 U/L (ref 0–44)
AST: 21 U/L (ref 15–41)
Albumin: 4.4 g/dL (ref 3.5–5.0)
Alkaline Phosphatase: 77 U/L (ref 38–126)
Anion gap: 10 (ref 5–15)
BUN: 27 mg/dL — ABNORMAL HIGH (ref 8–23)
CO2: 21 mmol/L — ABNORMAL LOW (ref 22–32)
Calcium: 9 mg/dL (ref 8.9–10.3)
Chloride: 103 mmol/L (ref 98–111)
Creatinine, Ser: 0.93 mg/dL (ref 0.44–1.00)
GFR calc Af Amer: >60 mL/min (ref >60)
GFR calc non Af Amer: 59 mL/min — ABNORMAL LOW (ref >60)
Glucose, Bld: 105 mg/dL — ABNORMAL HIGH (ref 70–99)
Potassium: 3.9 mmol/L (ref 3.5–5.1)
Sodium: 134 mmol/L — ABNORMAL LOW (ref 135–145)
Total Bilirubin: 0.7 mg/dL (ref 0.3–1.2)
Total Protein: 7.8 g/dL (ref 6.5–8.1)

## 2020-04-05 LAB — CBC
HCT: 33.8 % — ABNORMAL LOW (ref 36.0–46.0)
Hemoglobin: 11.6 g/dL — ABNORMAL LOW (ref 12.0–15.0)
MCH: 30.1 pg (ref 26.0–34.0)
MCHC: 34.3 g/dL (ref 30.0–36.0)
MCV: 87.8 fL (ref 80.0–100.0)
Platelets: 253 10*3/uL (ref 150–400)
RBC: 3.85 MIL/uL — ABNORMAL LOW (ref 3.87–5.11)
RDW: 14.8 % (ref 11.5–15.5)
WBC: 8.1 10*3/uL (ref 4.0–10.5)
nRBC: 0 % (ref 0.0–0.2)

## 2020-04-05 LAB — URINALYSIS, ROUTINE W REFLEX MICROSCOPIC
Bacteria, UA: NONE SEEN
Bilirubin Urine: NEGATIVE
Glucose, UA: NEGATIVE mg/dL
Ketones, ur: 5 mg/dL — AB
Nitrite: NEGATIVE
Protein, ur: NEGATIVE mg/dL
Specific Gravity, Urine: 1.031 — ABNORMAL HIGH (ref 1.005–1.030)
pH: 5 (ref 5.0–8.0)

## 2020-04-05 MED ORDER — CEPHALEXIN 500 MG PO CAPS
500.0000 mg | ORAL_CAPSULE | Freq: Once | ORAL | Status: AC
  Administered 2020-04-05: 500 mg via ORAL
  Filled 2020-04-05: qty 1

## 2020-04-05 MED ORDER — CEPHALEXIN 500 MG PO CAPS: 500.0000 mg | ORAL_CAPSULE | Freq: Three times a day (TID) | ORAL | 0 refills | Status: AC

## 2020-04-05 NOTE — ED Triage Notes (Signed)
Pt in via POV w/ son; reports receiving call from [facility] with complaints of new onset altered mental status, not keeping her clothes on at the facility.  Pt with advanced dementia at baseline but per son, she is more altered than normal.  Vitals WDL, NAD noted at this time.

## 2020-04-05 NOTE — ED Provider Notes (Signed)
[facility] Emergency Department Provider Note   ____________________________________________    I have reviewed the triage vital signs and the nursing notes.   HISTORY  Chief Complaint Altered Mental Status     HPI Mandy Reyes is a 79 y.o. female with a history of Alzheimer's dementia who has been staying in a care home.  Apparently patient was acting out, removing her clothing which staff felt was unusual for her.  They requested that she be evaluated medically.  Son is here with patient and reports that he tried to go to PCPs office but they were unable to get a urine sample and so was referred to the emergency department.  No reports of fevers or chills.  No vomiting.  No cough.  No shortness of  Past Medical History:  Diagnosis Date  . Alzheimer's dementia (HCC)     There are no problems to display for this patient.   History reviewed. No pertinent surgical history.  Prior to Admission medications   Medication Sig Start Date End Date Taking? Authorizing Provider  cephALEXin (KEFLEX) 500 MG capsule Take 1 capsule (500 mg total) by mouth 3 (three) times daily for 7 days. 04/05/20 04/12/20  Jene Every, MD  levothyroxine (SYNTHROID, LEVOTHROID) 50 MCG tablet Take 1 tablet by mouth daily. 06/19/13   [provider]  risperiDONE (RISPERDAL) 0.5 MG tablet Take 0.5 mg by mouth 2 (two) times daily.    [provider]  sertraline (ZOLOFT) 50 MG tablet Take 75 mg by mouth daily. 05/11/16   [provider]  vitamin B-12 (CYANOCOBALAMIN) 1000 MCG tablet Take 1 tablet by mouth daily.    [provider]     Allergies Patient has no known allergies.  No family history on file.  Social History Social History   Tobacco Use  . Smoking status: Former Games developer  . Smokeless tobacco: Never Used  Vaping Use  . Vaping Use: Never used  Substance Use Topics  . Alcohol use: No  . Drug use: No    Review of Systems  limited by Alzheimer's dementia     ____________________________________________   PHYSICAL EXAM:  VITAL SIGNS: ED Triage Vitals [04/05/20 1727]  Enc Vitals Group     BP 135/67     Pulse Rate 91     Resp 18     Temp 98.7 F (37.1 C)     Temp Source Oral     SpO2 96 %     Weight 68 kg (150 lb)     Height 1.651 m (5\' 5" )     Head Circumference      Peak Flow      Pain Score 0     Pain Loc      Pain Edu?      Excl. in GC?     Constitutional: Alert , No acute distress.  Calm  Nose: No congestion/rhinnorhea. Mouth/Throat: Mucous membranes are moist.    Cardiovascular: Normal rate, regular rhythm. Grossly normal heart sounds.  Good peripheral circulation. Respiratory: Normal respiratory effort.  No retractions. Lungs CTAB. Gastrointestinal: Soft and nontender. No distention.    Musculoskeletal: Warm and well perfused Neurologic:  Normal speech and language. No gross focal neurologic deficits are appreciated.  Skin:  Skin is warm, dry and intact. No rash noted. Psychiatric: Normal behavior here in the emergency department, normal speech, no aggressive behavior  ____________________________________________   LABS (all labs ordered are listed, but only abnormal results are displayed)  Labs  Reviewed  COMPREHENSIVE METABOLIC PANEL - Abnormal; Notable for the following components:      Result Value   Sodium 134 (*)    CO2 21 (*)    Glucose, Bld 105 (*)    BUN 27 (*)    GFR calc non Af Amer 59 (*)    All other components within normal limits  CBC - Abnormal; Notable for the following components:   RBC 3.85 (*)    Hemoglobin 11.6 (*)    HCT 33.8 (*)    All other components within normal limits  URINALYSIS, ROUTINE W REFLEX MICROSCOPIC - Abnormal; Notable for the following components:   Color, Urine YELLOW (*)    APPearance HAZY (*)    Specific Gravity, Urine 1.031 (*)    Hgb urine dipstick SMALL (*)    Ketones, ur 5 (*)    Leukocytes,Ua MODERATE (*)    All  other components within normal limits  URINE CULTURE  CBG MONITORING, ED   ____________________________________________  EKG  None ____________________________________________  RADIOLOGY  None ____________________________________________   PROCEDURES  Procedure(s) performed: No  Procedures   Critical Care performed: No ____________________________________________   INITIAL IMPRESSION / ASSESSMENT AND PLAN / ED COURSE  Pertinent labs & imaging results that were available during my care of the patient were reviewed by me and considered in my medical decision making (see chart for details).  Patient well-appearing and in no acute distress.  Behavioral disturbance could be related to her dementia possibly exacerbated by very mild urinary tract infection.  Lab work is overall reassuring, normal white blood cell count.  Afebrile.  P.o. Keflex ordered, no indication for further work-up here in the emergency department given that patient is calm with reassuring exam and labs.  Outpatient follow-up with PCP appropriate for discharge at this time    ____________________________________________   FINAL CLINICAL IMPRESSION(S) / ED DIAGNOSES  Final diagnoses:  Altered mental status, unspecified altered mental status type  Lower urinary tract infectious disease  Alzheimer's dementia with behavioral disturbance, unspecified timing of dementia onset (HCC)        Note:  This document was prepared using Dragon voice recognition software and may include unintentional dictation errors.   Jene Every, MD 04/05/20 2018

## 2020-04-27 ENCOUNTER — Ambulatory Visit: Payer: Medicare Other | Admitting: Podiatry

## 2020-10-15 ENCOUNTER — Encounter: Payer: Self-pay | Admitting: Podiatry

## 2020-10-15 ENCOUNTER — Ambulatory Visit: Payer: Medicare Other | Admitting: Podiatry

## 2020-10-15 ENCOUNTER — Other Ambulatory Visit: Payer: Self-pay

## 2020-10-15 DIAGNOSIS — M79675 Pain in left toe(s): Secondary | ICD-10-CM | POA: Diagnosis not present

## 2020-10-15 DIAGNOSIS — M79674 Pain in right toe(s): Secondary | ICD-10-CM

## 2020-10-15 DIAGNOSIS — B351 Tinea unguium: Secondary | ICD-10-CM

## 2020-10-15 NOTE — Progress Notes (Signed)
   SUBJECTIVE Patient presents to office today complaining of elongated, thickened nails that cause pain while ambulating in shoes.  She is unable to trim her own nails.  She presents today with her caregiver.  Patient is here for further evaluation and treatment.  Past Medical History:  Diagnosis Date  . Alzheimer's dementia [facility])     OBJECTIVE General Patient is awake, alert, and oriented x 3 and in no acute distress. Derm Skin is dry and supple bilateral. Negative open lesions or macerations. Remaining integument unremarkable. Nails are tender, long, thickened and dystrophic with subungual debris, consistent with onychomycosis, 1-5 bilateral. No signs of infection noted. Vasc  DP and PT pedal pulses palpable bilaterally. Temperature gradient within normal limits.  Neuro Epicritic and protective threshold sensation grossly intact bilaterally.  Musculoskeletal Exam No symptomatic pedal deformities noted bilateral. Muscular strength within normal limits.  ASSESSMENT 1. Onychodystrophic nails 1-5 bilateral with hyperkeratosis of nails.  2. Onychomycosis of nail due to dermatophyte bilateral 3. Pain in foot bilateral  PLAN OF CARE 1. Patient evaluated today.  2. Instructed to maintain good pedal hygiene and foot care.  3. Mechanical debridement of nails 1-5 bilaterally performed using a nail nipper. Filed with dremel without incident.  4. Return to clinic in 3 mos.    Felecia Shelling, DPM [facility]  Dr. Felecia Shelling, DPM    [address]                                        [place], [place] [address]                Office [phone]  Fax [phone]

## 2021-01-13 ENCOUNTER — Ambulatory Visit: Payer: Medicare Other | Admitting: Podiatry

## 2021-06-21 ENCOUNTER — Encounter (INDEPENDENT_AMBULATORY_CARE_PROVIDER_SITE_OTHER): Payer: Self-pay

## 2021-06-21 ENCOUNTER — Ambulatory Visit (INDEPENDENT_AMBULATORY_CARE_PROVIDER_SITE_OTHER): Admitting: Podiatry

## 2021-06-21 ENCOUNTER — Other Ambulatory Visit: Payer: Self-pay

## 2021-06-21 DIAGNOSIS — B351 Tinea unguium: Secondary | ICD-10-CM

## 2021-06-21 DIAGNOSIS — M79675 Pain in left toe(s): Secondary | ICD-10-CM | POA: Diagnosis not present

## 2021-06-21 DIAGNOSIS — M79674 Pain in right toe(s): Secondary | ICD-10-CM

## 2021-06-21 NOTE — Progress Notes (Signed)
   SUBJECTIVE Patient presents to office today complaining of elongated, thickened nails that cause pain while ambulating in shoes.  Patient is unable to trim their own nails. Patient is here for further evaluation and treatment.  Past Medical History:  Diagnosis Date   Alzheimer's dementia [facility])     OBJECTIVE General Patient is awake, alert, and oriented x 3 and in no acute distress. Derm Skin is dry and supple bilateral. Negative open lesions or macerations. Remaining integument unremarkable. Nails are tender, long, thickened and dystrophic with subungual debris, consistent with onychomycosis, 1-5 bilateral. No signs of infection noted. Vasc  DP and PT pedal pulses palpable bilaterally. Temperature gradient within normal limits.  Neuro Epicritic and protective threshold sensation grossly intact bilaterally.  Musculoskeletal Exam No symptomatic pedal deformities noted bilateral. Muscular strength within normal limits.  ASSESSMENT 1.  Pain due to onychomycosis of toenails both  PLAN OF CARE 1. Patient evaluated today.  2. Instructed to maintain good pedal hygiene and foot care.  3. Mechanical debridement of nails 1-5 bilaterally performed using a nail nipper. Filed with dremel without incident.  4. Return to clinic in 3 mos.    Felecia Shelling, DPM [facility]  Dr. Felecia Shelling, DPM    [address] [place], [place] [address]                Office [phone]  Fax [phone]

## 2021-06-24 ENCOUNTER — Ambulatory Visit: Payer: Medicare Other | Admitting: Podiatry

## 2021-09-22 ENCOUNTER — Other Ambulatory Visit: Payer: Self-pay

## 2021-09-22 ENCOUNTER — Encounter: Payer: Self-pay | Admitting: Podiatry

## 2021-09-22 ENCOUNTER — Ambulatory Visit (INDEPENDENT_AMBULATORY_CARE_PROVIDER_SITE_OTHER): Payer: Medicare Other | Admitting: Podiatry

## 2021-09-22 DIAGNOSIS — M79675 Pain in left toe(s): Secondary | ICD-10-CM | POA: Diagnosis not present

## 2021-09-22 DIAGNOSIS — M79674 Pain in right toe(s): Secondary | ICD-10-CM | POA: Diagnosis not present

## 2021-09-22 DIAGNOSIS — B351 Tinea unguium: Secondary | ICD-10-CM | POA: Diagnosis not present

## 2021-09-22 NOTE — Progress Notes (Signed)

## 2021-10-28 ENCOUNTER — Emergency Department

## 2021-10-28 ENCOUNTER — Emergency Department
Admission: EM | Admit: 2021-10-28 | Discharge: 2021-10-28 | Disposition: A | Discharge status: Home / Self Care | Attending: Emergency Medicine | Admitting: Emergency Medicine

## 2021-10-28 ENCOUNTER — Other Ambulatory Visit: Payer: Self-pay

## 2021-10-28 ENCOUNTER — Encounter: Payer: Self-pay | Admitting: Radiology

## 2021-10-28 DIAGNOSIS — Z23 Encounter for immunization: Secondary | ICD-10-CM | POA: Insufficient documentation

## 2021-10-28 DIAGNOSIS — S0101XA Laceration without foreign body of scalp, initial encounter: Secondary | ICD-10-CM | POA: Insufficient documentation

## 2021-10-28 DIAGNOSIS — J449 Chronic obstructive pulmonary disease, unspecified: Secondary | ICD-10-CM | POA: Insufficient documentation

## 2021-10-28 DIAGNOSIS — F0390 Unspecified dementia without behavioral disturbance: Secondary | ICD-10-CM | POA: Diagnosis not present

## 2021-10-28 DIAGNOSIS — M25552 Pain in left hip: Secondary | ICD-10-CM | POA: Insufficient documentation

## 2021-10-28 DIAGNOSIS — W108XXA Fall (on) (from) other stairs and steps, initial encounter: Secondary | ICD-10-CM | POA: Insufficient documentation

## 2021-10-28 DIAGNOSIS — M25551 Pain in right hip: Secondary | ICD-10-CM

## 2021-10-28 DIAGNOSIS — S0990XA Unspecified injury of head, initial encounter: Secondary | ICD-10-CM | POA: Diagnosis present

## 2021-10-28 MED ORDER — MORPHINE SULFATE (PF) 2 MG/ML IV SOLN
2.0000 mg | Freq: Once | INTRAVENOUS | Status: AC
  Administered 2021-10-28: 2 mg via INTRAMUSCULAR
  Filled 2021-10-28: qty 1

## 2021-10-28 MED ORDER — TETANUS-DIPHTH-ACELL PERTUSSIS 5-2.5-18.5 LF-MCG/0.5 IM SUSY
0.5000 mL | PREFILLED_SYRINGE | Freq: Once | INTRAMUSCULAR | Status: AC
  Administered 2021-10-28: 0.5 mL via INTRAMUSCULAR
  Filled 2021-10-28: qty 0.5

## 2021-10-28 NOTE — ED Notes (Signed)
Staff [facility] will be transporting pt back to facility
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
## 2021-10-28 NOTE — Discharge Instructions (Addendum)
The x-rays of the pelvis, left hand, and neck were all okay.  The 2 lacerations on the top of her head and the lower back of her head were closed with staples.  These can be removed in 2 weeks.  You can give Tylenol 650 mg every 6 hours and ibuprofen 400 mg every 6 hours for pain.

## 2021-10-28 NOTE — ED Notes (Signed)
Facility came to transport patient back to facility. Pt wouldn't open eyes not following commands well. Family states she is at baseline with her dementia. Family stated they normally have to pick her up and put her in vehicles. Pt transported back to her facility in a private car.

## 2021-10-28 NOTE — ED Triage Notes (Signed)
Pt was found at the bottom of 11 stairs after apparently falling down them to a basement. Unknown how long she had been down. Pt present with a laceration to the lac of her head, top of her head, left middle finger laceration as well as a scuff mark to her left elbow. Pt normally nonverbal for the most part.

## 2021-10-28 NOTE — ED Provider Notes (Signed)
[facility] Provider Note    Event Date/Time   First MD Initiated Contact with Patient 10/28/21 [phone]     (approximate)   History   Fall   HPI Patient is unable to provide any history due to advanced dementia  Mandy Reyes is a 81 y.o. female with a history of dementia, COPD who was sent to the ED from her care home due to an unwitnessed fall.  Reportedly, care home staff was doing some cleaning and had opened the basement door this morning to retrieve some cleaning supplies.  Later they found that the patient had fallen down the stairs to the basement onto the concrete floor below.  She was conscious when they found her.    Reviewed MAR and medical history notes accompanying the patient.  CODE STATUS is DNR.     Physical Exam   Triage Vital Signs: ED Triage Vitals [10/28/21 0932]  Enc Vitals Group     BP      Pulse      Resp      Temp      Temp src      SpO2 97 %     Weight      Height      Head Circumference      Peak Flow      Pain Score      Pain Loc      Pain Edu?      Excl. in [place]?     Most recent vital signs: Vitals:   10/28/21 0932 10/28/21 0936  BP:  120/71  Pulse:  60  Resp:  20  Temp:  (!) 97.2 F (36.2 C)  SpO2: 97% 97%     General: Awake, no distress.  CV:  Good peripheral perfusion.  Regular rate and rhythm Resp:  Normal effort.  Clear to auscultation bilaterally Abd:  No distention.  Soft and nontender Other:  Small laceration at the crown of the head as well as at the occiput.  There is some small hematoma formation which is soft.  No active bleeding.  No C-spine tenderness.  There are contusions at the left second and third MCP joints, skeletal exam is otherwise unremarkable.  Chest wall and pelvis are stable, full range of motion in all extremities.  There is bruising of the right gluteus, tissues are soft.   ED Results / Procedures / Treatments   Labs (all labs ordered are listed, but only abnormal results  are displayed) Labs Reviewed - No data to display    EKG  Interpreted by me Interpretation severely limited by tremor.  Most likely sinus rhythm.  Rate of 63.  Normal axis.  No identifiable ST or T wave changes.   RADIOLOGY X-ray cervical spine viewed and interpreted by me, no fracture.  Radiology report reviewed.  X-ray left hand and x-ray pelvis also viewed by me, no apparent fracture.  Radiology report agrees.    PROCEDURES:  Critical Care performed: No  ..Laceration Repair  Date/Time: 10/28/2021 11:39 AM Performed by: Sharman Cheek, MD Authorized by: Sharman Cheek, MD   Consent:    Consent obtained:  Verbal   Consent given by:  Patient and healthcare agent (Son at bedside)   Risks discussed:  Infection, pain, retained foreign body, poor cosmetic result and poor wound healing Universal protocol:    Patient identity confirmed:  Arm band Anesthesia:    Anesthesia method:  None Laceration details:    Location:  Scalp  Scalp location:  Crown   Length (cm):  2 Pre-procedure details:    Preparation:  Patient was prepped and draped in usual sterile fashion Exploration:    Hemostasis achieved with:  Direct pressure   Wound exploration: entire depth of wound visualized     Wound extent: no foreign bodies/material noted, no muscle damage noted, no nerve damage noted, no underlying fracture noted and no vascular damage noted     Contaminated: no   Treatment:    Area cleansed with:  Saline and povidone-iodine   Amount of cleaning:  Extensive   Irrigation solution:  Sterile saline   Visualized foreign bodies/material removed: no     Debridement:  None   Undermining:  None Skin repair:    Repair method:  Staples   Number of staples:  2 Approximation:    Approximation:  Close Repair type:    Repair type:  Simple Post-procedure details:    Dressing:  Open (no dressing)   Procedure completion:  Tolerated well, no immediate complications Comments:         Marland KitchenMarland KitchenLaceration Repair  Date/Time: 10/28/2021 11:41 AM Performed by: Sharman Cheek, MD Authorized by: Sharman Cheek, MD   Consent:    Consent obtained:  Verbal   Consent given by:  Healthcare agent and patient (Son at bedside)   Risks discussed:  Infection, pain, retained foreign body, poor cosmetic result and poor wound healing Universal protocol:    Patient identity confirmed:  Arm band Anesthesia:    Anesthesia method:  None Laceration details:    Location:  Scalp   Scalp location:  Occipital   Length (cm):  5 Pre-procedure details:    Preparation:  Patient was prepped and draped in usual sterile fashion Exploration:    Hemostasis achieved with:  Direct pressure   Wound exploration: entire depth of wound visualized     Wound extent: no foreign bodies/material noted, no muscle damage noted, no nerve damage noted, no tendon damage noted, no underlying fracture noted and no vascular damage noted     Contaminated: no   Treatment:    Area cleansed with:  Saline and povidone-iodine   Amount of cleaning:  Extensive   Irrigation solution:  Sterile saline   Visualized foreign bodies/material removed: no     Debridement:  None   Undermining:  None Skin repair:    Repair method:  Staples   Number of staples:  5 Approximation:    Approximation:  Close Repair type:    Repair type:  Simple Post-procedure details:    Dressing:  Sterile dressing   Procedure completion:  Tolerated well, no immediate complications   MEDICATIONS ORDERED IN ED: Medications  Tdap (BOOSTRIX) injection 0.5 mL (0.5 mLs Intramuscular Given 10/28/21 0943)  morphine (PF) 2 MG/ML injection 2 mg (2 mg Intramuscular Given 10/28/21 1036)     IMPRESSION / MDM / ASSESSMENT AND PLAN / ED COURSE  I reviewed the triage vital signs and the nursing notes.                              Differential diagnosis includes, but is not limited to, intracranial hemorrhage, C-spine fracture, hand fracture, anemia,  electrolyte disturbance    Patient brought to ED after fall downstairs for trauma evaluation.  Most likely this is mechanical.  Patient is not able to provide any significant history and is DNR.  Will obtain imaging, provide wound care and laceration closure, update tetanus. Clinical  Course as of 10/28/21 1139  Fri Oct 28, 2021  0945 Patient's condition including baseline state of health and new injuries discussed with her son Burman Riis.  He agrees with deferring CT imaging at this time since the patient would not be an acceptable surgical candidate and is under hospice care. [PS]  [phone] Hospice representative also advises that according to patient's current plan of care, CT imaging and lab work is not necessary. [PS]    Clinical Course User Index [PS] Sharman Cheek, MD    ----------------------------------------- 11:39 AM on 10/28/2021 ----------------------------------------- X-rays all unremarkable.  She has a contusion to her gluteus without evidence of fracture or compartment syndrome or large hematoma.  In accordance with her hospice care plan, no further work-up needed at this time, wounds are repaired, stable for discharge.  Son at bedside agrees.   FINAL CLINICAL IMPRESSION(S) / ED DIAGNOSES   Final diagnoses:  Right hip pain  Laceration of scalp, initial encounter  Dementia, unspecified dementia severity, unspecified dementia type, unspecified whether behavioral, psychotic, or mood disturbance or anxiety (HCC)     Rx / DC Orders   ED Discharge Orders     None        Note:  This document was prepared using Dragon voice recognition software and may include unintentional dictation errors.   Sharman Cheek, MD 10/28/21 1143

## 2021-12-26 ENCOUNTER — Encounter: Payer: Self-pay | Admitting: Podiatry

## 2021-12-26 ENCOUNTER — Ambulatory Visit (INDEPENDENT_AMBULATORY_CARE_PROVIDER_SITE_OTHER): Payer: Medicare Other | Admitting: Podiatry

## 2021-12-26 DIAGNOSIS — B351 Tinea unguium: Secondary | ICD-10-CM | POA: Diagnosis not present

## 2021-12-26 DIAGNOSIS — M79675 Pain in left toe(s): Secondary | ICD-10-CM | POA: Diagnosis not present

## 2021-12-26 DIAGNOSIS — M79674 Pain in right toe(s): Secondary | ICD-10-CM

## 2021-12-26 NOTE — Progress Notes (Signed)

## 2022-02-06 ENCOUNTER — Ambulatory Visit: Payer: Medicare Other | Admitting: Podiatry

## 2022-04-17 ENCOUNTER — Ambulatory Visit: Payer: Medicare Other | Admitting: Podiatry

## 2022-04-20 ENCOUNTER — Ambulatory Visit: Payer: Medicare Other | Admitting: Podiatry

## 2022-04-20 ENCOUNTER — Ambulatory Visit (INDEPENDENT_AMBULATORY_CARE_PROVIDER_SITE_OTHER): Payer: Medicare Other | Admitting: Podiatry

## 2022-04-20 ENCOUNTER — Encounter: Payer: Self-pay | Admitting: Podiatry

## 2022-04-20 DIAGNOSIS — M79675 Pain in left toe(s): Secondary | ICD-10-CM | POA: Diagnosis not present

## 2022-04-20 DIAGNOSIS — B351 Tinea unguium: Secondary | ICD-10-CM | POA: Diagnosis not present

## 2022-04-20 DIAGNOSIS — M79674 Pain in right toe(s): Secondary | ICD-10-CM | POA: Diagnosis not present

## 2022-04-20 NOTE — Progress Notes (Signed)

## 2022-06-06 ENCOUNTER — Ambulatory Visit: Payer: Medicare Other | Admitting: Podiatry

## 2022-06-13 ENCOUNTER — Ambulatory Visit (INDEPENDENT_AMBULATORY_CARE_PROVIDER_SITE_OTHER): Payer: Medicare Other | Admitting: Podiatry

## 2022-06-13 ENCOUNTER — Encounter: Payer: Self-pay | Admitting: Podiatry

## 2022-06-13 DIAGNOSIS — M79674 Pain in right toe(s): Secondary | ICD-10-CM | POA: Diagnosis not present

## 2022-06-13 DIAGNOSIS — B351 Tinea unguium: Secondary | ICD-10-CM

## 2022-06-13 DIAGNOSIS — M79675 Pain in left toe(s): Secondary | ICD-10-CM

## 2022-06-13 NOTE — Progress Notes (Signed)
This patient returns to the office for evaluation and treatment of long thick painful nails .  This patient is unable to trim her own nails since the patient cannot reach her feet.  Patient says the nails are painful walking and wearing her shoes.  She returns for preventive foot care services.  General Appearance  Alert, conversant and in no acute stress.  Vascular  Dorsalis pedis and posterior tibial  pulses are palpable  bilaterally.  Capillary return is within normal limits  bilaterally. Temperature is within normal limits  bilaterally.  Neurologic  Senn-Weinstein monofilament wire test within normal limits  bilaterally. Muscle power within normal limits bilaterally.  Nails Thick disfigured discolored nails with subungual debris  from hallux to fifth toes bilaterally. No evidence of bacterial infection or drainage bilaterally.  Orthopedic  No limitations of motion  feet .  No crepitus or effusions noted.  No bony pathology or digital deformities noted.  Skin  normotropic skin with no porokeratosis noted bilaterally.  No signs of infections or ulcers noted.     Onychomycosis  Pain in toes right foot  Pain in toes left foot  Debridement  of nails  1-5  B/L with a nail nipper.  Nails were then filed using a dremel tool with no incidents.    RTC  3 months   Boneta Lucks D.P.M.

## 2022-07-13 ENCOUNTER — Ambulatory Visit: Payer: Medicare Other | Admitting: Podiatry

## 2022-07-24 ENCOUNTER — Ambulatory Visit: Payer: Medicare Other | Admitting: Podiatry

## 2022-09-14 ENCOUNTER — Ambulatory Visit: Payer: Medicare Other | Admitting: Podiatry

## 2022-09-15 ENCOUNTER — Ambulatory Visit: Payer: Medicare Other | Admitting: Podiatry

## 2022-10-13 ENCOUNTER — Ambulatory Visit: Payer: Medicare Other | Admitting: Podiatry

## 2022-11-09 ENCOUNTER — Encounter: Payer: Self-pay | Admitting: Podiatry

## 2022-11-09 ENCOUNTER — Ambulatory Visit (INDEPENDENT_AMBULATORY_CARE_PROVIDER_SITE_OTHER): Payer: Medicare Other | Admitting: Podiatry

## 2022-11-09 DIAGNOSIS — M79674 Pain in right toe(s): Secondary | ICD-10-CM

## 2022-11-09 DIAGNOSIS — B351 Tinea unguium: Secondary | ICD-10-CM

## 2022-11-09 DIAGNOSIS — M79675 Pain in left toe(s): Secondary | ICD-10-CM | POA: Diagnosis not present

## 2022-11-09 NOTE — Progress Notes (Signed)
This patient returns to the office for evaluation and treatment of long thick painful nails .  This patient is unable to trim her own nails since the patient cannot reach her feet.  Patient says the nails are painful walking and wearing her shoes.  She returns for preventive foot care services.  General Appearance  Alert, conversant and in no acute stress.  Vascular  Dorsalis pedis and posterior tibial  pulses are palpable  bilaterally.  Capillary return is within normal limits  bilaterally. Temperature is within normal limits  bilaterally.  Neurologic  Senn-Weinstein monofilament wire test within normal limits  bilaterally. Muscle power within normal limits bilaterally.  Nails Thick disfigured discolored nails with subungual debris  from hallux to fifth toes bilaterally. No evidence of bacterial infection or drainage bilaterally.  Orthopedic  No limitations of motion  feet .  No crepitus or effusions noted.  No bony pathology or digital deformities noted.  Skin  normotropic skin with no porokeratosis noted bilaterally.  No signs of infections or ulcers noted.     Onychomycosis  Pain in toes right foot  Pain in toes left foot  Debridement  of nails  1-5  B/L with a nail nipper.  Nails were then filed using a dremel tool with no incidents.    RTC  3 months   Gardiner Barefoot DPM

## 2022-11-14 ENCOUNTER — Ambulatory Visit: Payer: Medicare Other | Admitting: Internal Medicine

## 2022-11-18 ENCOUNTER — Emergency Department: Payer: Medicare Other

## 2022-11-18 ENCOUNTER — Inpatient Hospital Stay
Admission: EM | Admit: 2022-11-18 | Discharge: 2022-11-22 | DRG: 085 | Disposition: A | Discharge status: Hospice | Payer: Medicare Other | Source: Skilled Nursing Facility | Attending: Student | Admitting: Student

## 2022-11-18 ENCOUNTER — Other Ambulatory Visit: Payer: Self-pay

## 2022-11-18 DIAGNOSIS — E43 Unspecified severe protein-calorie malnutrition: Secondary | ICD-10-CM | POA: Diagnosis present

## 2022-11-18 DIAGNOSIS — S065XAA Traumatic subdural hemorrhage with loss of consciousness status unknown, initial encounter: Secondary | ICD-10-CM

## 2022-11-18 DIAGNOSIS — E872 Acidosis, unspecified: Secondary | ICD-10-CM | POA: Diagnosis present

## 2022-11-18 DIAGNOSIS — I619 Nontraumatic intracerebral hemorrhage, unspecified: Secondary | ICD-10-CM | POA: Diagnosis present

## 2022-11-18 DIAGNOSIS — B9620 Unspecified Escherichia coli [E. coli] as the cause of diseases classified elsewhere: Secondary | ICD-10-CM | POA: Diagnosis present

## 2022-11-18 DIAGNOSIS — G309 Alzheimer's disease, unspecified: Secondary | ICD-10-CM | POA: Diagnosis present

## 2022-11-18 DIAGNOSIS — S066X0A Traumatic subarachnoid hemorrhage without loss of consciousness, initial encounter: Secondary | ICD-10-CM | POA: Diagnosis present

## 2022-11-18 DIAGNOSIS — W109XXA Fall (on) (from) unspecified stairs and steps, initial encounter: Secondary | ICD-10-CM | POA: Diagnosis present

## 2022-11-18 DIAGNOSIS — F0283 Dementia in other diseases classified elsewhere, unspecified severity, with mood disturbance: Secondary | ICD-10-CM | POA: Diagnosis present

## 2022-11-18 DIAGNOSIS — Z1612 Extended spectrum beta lactamase (ESBL) resistance: Secondary | ICD-10-CM | POA: Diagnosis present

## 2022-11-18 DIAGNOSIS — S065X0A Traumatic subdural hemorrhage without loss of consciousness, initial encounter: Secondary | ICD-10-CM | POA: Diagnosis not present

## 2022-11-18 DIAGNOSIS — Z79899 Other long term (current) drug therapy: Secondary | ICD-10-CM

## 2022-11-18 DIAGNOSIS — N390 Urinary tract infection, site not specified: Secondary | ICD-10-CM | POA: Diagnosis present

## 2022-11-18 DIAGNOSIS — N3000 Acute cystitis without hematuria: Secondary | ICD-10-CM

## 2022-11-18 DIAGNOSIS — L03811 Cellulitis of head [any part, except face]: Secondary | ICD-10-CM

## 2022-11-18 DIAGNOSIS — E538 Deficiency of other specified B group vitamins: Secondary | ICD-10-CM | POA: Diagnosis present

## 2022-11-18 DIAGNOSIS — Y92129 Unspecified place in nursing home as the place of occurrence of the external cause: Secondary | ICD-10-CM

## 2022-11-18 DIAGNOSIS — E039 Hypothyroidism, unspecified: Secondary | ICD-10-CM | POA: Diagnosis present

## 2022-11-18 DIAGNOSIS — S06360A Traumatic hemorrhage of cerebrum, unspecified, without loss of consciousness, initial encounter: Secondary | ICD-10-CM

## 2022-11-18 DIAGNOSIS — R402142 Coma scale, eyes open, spontaneous, at arrival to emergency department: Secondary | ICD-10-CM | POA: Diagnosis present

## 2022-11-18 DIAGNOSIS — F0390 Unspecified dementia without behavioral disturbance: Secondary | ICD-10-CM | POA: Diagnosis present

## 2022-11-18 DIAGNOSIS — S06340A Traumatic hemorrhage of right cerebrum without loss of consciousness, initial encounter: Secondary | ICD-10-CM

## 2022-11-18 DIAGNOSIS — Z66 Do not resuscitate: Secondary | ICD-10-CM | POA: Diagnosis present

## 2022-11-18 DIAGNOSIS — E871 Hypo-osmolality and hyponatremia: Secondary | ICD-10-CM | POA: Diagnosis present

## 2022-11-18 DIAGNOSIS — Z515 Encounter for palliative care: Secondary | ICD-10-CM

## 2022-11-18 DIAGNOSIS — Z79890 Hormone replacement therapy (postmenopausal): Secondary | ICD-10-CM

## 2022-11-18 DIAGNOSIS — S0003XA Contusion of scalp, initial encounter: Secondary | ICD-10-CM

## 2022-11-18 DIAGNOSIS — Z681 Body mass index (BMI) 19 or less, adult: Secondary | ICD-10-CM

## 2022-11-18 DIAGNOSIS — E876 Hypokalemia: Secondary | ICD-10-CM | POA: Diagnosis present

## 2022-11-18 DIAGNOSIS — Z87891 Personal history of nicotine dependence: Secondary | ICD-10-CM

## 2022-11-18 DIAGNOSIS — R402342 Coma scale, best motor response, flexion withdrawal, at arrival to emergency department: Secondary | ICD-10-CM | POA: Diagnosis present

## 2022-11-18 DIAGNOSIS — R402222 Coma scale, best verbal response, incomprehensible words, at arrival to emergency department: Secondary | ICD-10-CM | POA: Diagnosis present

## 2022-11-18 DIAGNOSIS — W19XXXA Unspecified fall, initial encounter: Principal | ICD-10-CM

## 2022-11-18 LAB — BASIC METABOLIC PANEL
Anion gap: 7 (ref 5–15)
BUN: 16 mg/dL (ref 8–23)
CO2: 22 mmol/L (ref 22–32)
Calcium: 8.4 mg/dL — ABNORMAL LOW (ref 8.9–10.3)
Chloride: 101 mmol/L (ref 98–111)
Creatinine, Ser: 0.59 mg/dL (ref 0.44–1.00)
GFR, Estimated: >60 mL/min (ref >60)
Glucose, Bld: 88 mg/dL (ref 70–99)
Potassium: 3.5 mmol/L (ref 3.5–5.1)
Sodium: 130 mmol/L — ABNORMAL LOW (ref 135–145)

## 2022-11-18 LAB — CBC WITH DIFFERENTIAL/PLATELET
Abs Immature Granulocytes: 0.05 10*3/uL (ref 0.00–0.07)
Basophils Absolute: 0.1 10*3/uL (ref 0.0–0.1)
Basophils Relative: 0 %
Eosinophils Absolute: 0 10*3/uL (ref 0.0–0.5)
Eosinophils Relative: 0 %
HCT: 34.5 % — ABNORMAL LOW (ref 36.0–46.0)
Hemoglobin: 11.4 g/dL — ABNORMAL LOW (ref 12.0–15.0)
Immature Granulocytes: 0 %
Lymphocytes Relative: 7 %
Lymphs Abs: 0.9 10*3/uL (ref 0.7–4.0)
MCH: 29.2 pg (ref 26.0–34.0)
MCHC: 33 g/dL (ref 30.0–36.0)
MCV: 88.2 fL (ref 80.0–100.0)
Monocytes Absolute: 0.7 10*3/uL (ref 0.1–1.0)
Monocytes Relative: 5 %
Neutro Abs: 11.7 10*3/uL — ABNORMAL HIGH (ref 1.7–7.7)
Neutrophils Relative %: 88 %
Platelets: 249 10*3/uL (ref 150–400)
RBC: 3.91 MIL/uL (ref 3.87–5.11)
RDW: 16.1 % — ABNORMAL HIGH (ref 11.5–15.5)
WBC: 13.4 10*3/uL — ABNORMAL HIGH (ref 4.0–10.5)
nRBC: 0 % (ref 0.0–0.2)

## 2022-11-18 LAB — APTT: aPTT: 33 seconds (ref 24–36)

## 2022-11-18 LAB — URINALYSIS, ROUTINE W REFLEX MICROSCOPIC
Bilirubin Urine: NEGATIVE
Glucose, UA: NEGATIVE mg/dL
Ketones, ur: 20 mg/dL — AB
Nitrite: POSITIVE — AB
Protein, ur: NEGATIVE mg/dL
Specific Gravity, Urine: 1.02 (ref 1.005–1.030)
pH: 5 (ref 5.0–8.0)

## 2022-11-18 LAB — PROTIME-INR
INR: 1.2 (ref 0.8–1.2)
Prothrombin Time: 15.5 seconds — ABNORMAL HIGH (ref 11.4–15.2)

## 2022-11-18 MED ORDER — CEFDINIR 300 MG PO CAPS: 600.0000 mg | ORAL_CAPSULE | Freq: Every day | ORAL | 0 refills | Status: DC

## 2022-11-18 MED ORDER — CEFDINIR 300 MG PO CAPS
600.0000 mg | ORAL_CAPSULE | Freq: Once | ORAL | Status: DC
  Filled 2022-11-18: qty 2

## 2022-11-18 MED ORDER — CEFDINIR 250 MG/5ML PO SUSR: 600.0000 mg | Freq: Every day | ORAL | 0 refills | Status: DC

## 2022-11-18 MED ORDER — CEFDINIR 250 MG/5ML PO SUSR
600.0000 mg | Freq: Once | ORAL | Status: AC
  Administered 2022-11-18: 600 mg via ORAL
  Filled 2022-11-18: qty 12

## 2022-11-18 MED ORDER — LEVETIRACETAM IN NACL 500 MG/100ML IV SOLN
500.0000 mg | Freq: Once | INTRAVENOUS | Status: AC
  Administered 2022-11-18: 500 mg via INTRAVENOUS
  Filled 2022-11-18: qty 100

## 2022-11-18 MED ORDER — IOHEXOL 350 MG/ML SOLN
75.0000 mL | Freq: Once | INTRAVENOUS | Status: AC | PRN
  Administered 2022-11-18: 75 mL via INTRAVENOUS

## 2022-11-18 NOTE — Assessment & Plan Note (Signed)
Bedside swallow.  Cont levothyroxine 50 mcg.

## 2022-11-18 NOTE — ED Provider Notes (Signed)
-----------------------------------------   10:41 PM on 11/18/2022 -----------------------------------------  Blood pressure 110/68, pulse 63, temperature 99.8 F (37.7 C), temperature source Axillary, resp. rate 16, SpO2 97 %.  Assuming care from Dr. Charna Archer.  In short, Mandy Reyes is a 82 y.o. female with a chief complaint of Fall .  Refer to the original H&P for additional details.  The current plan of care is to await CT scan at the 6-hour mark to evaluate patient's intracranial hemorrhage from trauma.  Patient has maintained her vital signs, is roughly the same mentation as time of handoff.  Patient is nonverbal and demented.  She is awake, will follow you around the room with her eyes.  Unable to grossly follow commands in regards to cranial nerve testing.  Patient's results returned with worsening subarachnoid component though this is very mild in nature.  Patient also has development of a large intraparenchymal hematoma measuring up to 3.1 cm.  There is no midline shift.  At this time I reached out to Dr. Lacinda Axon with neurosurgery for follow-up.  There is no immediate intervention deemed necessary.  Patiently placed on Keppra.  Recommendation is to keep blood pressure below 140.  Patient arrives without documentation from her long-term care facility.  This is in regards to her DNR status.  We reached out to the facility and they advised that they had her DNR paperwork but it is not currently with the patient.  I reached out to the patient's son who is the legal guardian for verbal confirmation on patient's DNR status.  Family advises that they would like to make her comfortable but she is a DNR.  They do not wish to have any significant interventions such as surgical intervention for this head bleed.  At this time I will reach out to the hospitalist team for admission.   .Critical Care  Performed by: Darletta Moll, PA-C Authorized by: Darletta Moll, PA-C   Critical care  provider statement:    Critical care time (minutes):  45   Critical care time was exclusive of:  Separately billable procedures and treating other patients and teaching time   Critical care was necessary to treat or prevent imminent or life-threatening deterioration of the following conditions:  CNS failure or compromise   Critical care was time spent personally by me on the following activities:  Examination of patient, discussions with consultants, development of treatment plan with patient or surrogate, ordering and review of radiographic studies, pulse oximetry, re-evaluation of patient's condition and review of old charts   I assumed direction of critical care for this patient from another provider in my specialty: yes     Care discussed with: admitting provider      ED diagnosis:  Fall Scalp hematoma Subdural hematoma Intraparenchymal hematoma

## 2022-11-18 NOTE — ED Notes (Signed)
The pt's clothing was changed and her 3 skin tears were dressed. The pt was placed on a purwick.

## 2022-11-18 NOTE — ED Triage Notes (Signed)
First Nurse Note;  Pt via [facility] from [facility], pt had a mechanical fall down 2 steps. Lac to the back of her denits blood thinners. No LOC. Pt is non-verbal and has a hx of dementia at baseline. Pt is at her baseline per EMS and staff.  EMS reports 170 CBG, 68 HR, 97% on RA, 114/62 BP.

## 2022-11-18 NOTE — Assessment & Plan Note (Signed)
Cont Aricept / risperidone and zokoft.

## 2022-11-18 NOTE — ED Triage Notes (Addendum)
Pt to ED via [facility] from [facility]. Staff reports mechanical fall down 2 steps. Pt not verbal with this RN and not expressing pain but trying to get out of wheelchair. Pt with hx of dementia - EMS states pt is at her baseline. Pt has skin tears on head, skin tears to hand, laceration on back of head and hematoma on right side of forehead.

## 2022-11-18 NOTE — ED Provider Notes (Signed)
[facility] Provider Note    Event Date/Time   First MD Initiated Contact with Patient 11/18/22 1417     (approximate)   History   Chief Complaint Fall   HPI  Mandy Reyes is a 82 y.o. female with past medical history of Alzheimer's dementia and hypothyroidism who presents to the ED following fall.  History is limited due to patient's advanced dementia and majority of history is obtained from employee at her care home.  They state that patient had wandered outside of the care home and fell on a couple of steps outside the property.  She hit her head but did not lose consciousness and does not take any blood thinners.  Employee states that patient has been acting appropriately since then with no change from her typical baseline.  Patient is nonverbal and unable to provide any history.     Physical Exam   Triage Vital Signs: ED Triage Vitals [11/18/22 1401]  Enc Vitals Group     BP 116/86     Pulse Rate 71     Resp 18     Temp 98 F (36.7 C)     Temp Source Axillary     SpO2 98 %     Weight      Height      Head Circumference      Peak Flow      Pain Score      Pain Loc      Pain Edu?      Excl. in [place]?     Most recent vital signs: Vitals:   11/18/22 1401  BP: 116/86  Pulse: 71  Resp: 18  Temp: 98 F (36.7 C)  SpO2: 98%    Constitutional: Awake and alert, disoriented. Eyes: Conjunctivae are normal. Head: Abrasion and hematoma to right frontal scalp Nose: No congestion/rhinnorhea. Mouth/Throat: Mucous membranes are moist.  Neck: No midline cervical spine tenderness to palpation. Cardiovascular: Normal rate, regular rhythm. Grossly normal heart sounds.  2+ radial pulses bilaterally. Respiratory: Normal respiratory effort.  No retractions. Lungs CTAB.  Left chest wall abrasion noted with no obvious tenderness to palpation. Gastrointestinal: Soft and nontender. No distention. Musculoskeletal: No lower extremity tenderness nor edema.   No upper extremity bony tenderness to palpation. Neurologic:  Normal speech and language. No gross focal neurologic deficits are appreciated.    ED Results / Procedures / Treatments   Labs (all labs ordered are listed, but only abnormal results are displayed) Labs Reviewed  CBC WITH DIFFERENTIAL/PLATELET - Abnormal; Notable for the following components:      Result Value   WBC 13.4 (*)    Hemoglobin 11.4 (*)    HCT 34.5 (*)    RDW 16.1 (*)    Neutro Abs 11.7 (*)    All other components within normal limits  BASIC METABOLIC PANEL - Abnormal; Notable for the following components:   Sodium 130 (*)    Calcium 8.4 (*)    All other components within normal limits  URINALYSIS, ROUTINE W REFLEX MICROSCOPIC    ED ECG REPORT I, Blake Divine, the attending physician, personally viewed and interpreted this ECG.   Date: 11/18/2022  EKG Time: 15:59  Rate: 59  Rhythm: sinus bradycardia  Axis: Normal  Intervals:none  ST&T Change: None   RADIOLOGY CT head reviewed and interpreted by me with small amount of subdural hemorrhage, no midline shift or skull fracture noted.  PROCEDURES:  Critical Care performed: Yes, see critical care procedure  note(s)  .Critical Care  Performed by: Blake Divine, MD Authorized by: Blake Divine, MD   Critical care provider statement:    Critical care time (minutes):  30   Critical care time was exclusive of:  Separately billable procedures and treating other patients and teaching time   Critical care was necessary to treat or prevent imminent or life-threatening deterioration of the following conditions:  Trauma   Critical care was time spent personally by me on the following activities:  Development of treatment plan with patient or surrogate, discussions with consultants, evaluation of patient's response to treatment, examination of patient, ordering and review of laboratory studies, ordering and review of radiographic studies, ordering and  performing treatments and interventions, pulse oximetry, re-evaluation of patient's condition and review of old charts   I assumed direction of critical care for this patient from another provider in my specialty: no      MEDICATIONS ORDERED IN ED: Medications - No data to display   IMPRESSION / MDM / [place] / ED COURSE  I reviewed the triage vital signs and the nursing notes.                              82 y.o. female with past medical history of Alzheimer's dementia and hypothyroidism who presents to the ED following witnessed fall at her care home where she tripped on the stairs, striking her head without LOC.  Patient's presentation is most consistent with acute presentation with potential threat to life or bodily function.  Differential diagnosis includes, but is not limited to, intracranial hemorrhage, skull fracture, cervical spine injury, rib fracture, hemothorax, pneumothorax.  Patient nontoxic-appearing and in no acute distress, vital signs are unremarkable.  She has a hematoma and abrasion to her right frontal scalp and we will further assess with CT imaging of her head and cervical spine.  Abrasions also noted to her left chest wall and we will check chest x-ray.  No evidence of bony injury to her extremities and her abdominal exam is benign.  She is at her baseline mental status per employee from her care home.  CT imaging is concerning for small subdural hematoma and subarachnoid hemorrhage with no apparent mass effect.  CT cervical spine is negative for acute process and chest x-ray is also unremarkable.  Findings reviewed with Dr. Lacinda Axon of neurosurgery, who recommends follow-up CT imaging in 6 hours to ensure stability.  Patient remains at her baseline mental status and her son, who is her legal guardian, was updated via phone.  Patient turned over to oncoming divider pending follow-up CT results.      FINAL CLINICAL IMPRESSION(S) / ED DIAGNOSES   Final  diagnoses:  Fall, initial encounter  Hematoma of scalp, initial encounter  SDH (subdural hematoma) ([place])     Rx / DC Orders   ED Discharge Orders     None        Note:  This document was prepared using Dragon voice recognition software and may include unintentional dictation errors.   Blake Divine, MD 11/18/22 [phone]

## 2022-11-18 NOTE — H&P (Signed)
History and Physical    Chief Complaint: Fall    HISTORY OF PRESENT ILLNESS: Mandy Reyes is an 82 y.o. female  seen today after fall . Pt has dementia but is ambulatory at baseline and was wandering outside and fall and hit her head with laceration to back of her head and hematoma on right side of forehead. Fall was down 2 steps.  HPI is limited secondary to dementia patient has not per most name.  Chart review shows that podiatry visit states that patient was describing difficulty reaching her feet and painful nails so I am not sure about her being nonverbal at baseline, I suspect she is a poor historian or so because of the dementia.  Pt has  Past Medical History:  Diagnosis Date   Alzheimer's dementia ([place])    Review of Systems  Unable to perform ROS: Patient nonverbal   No Known Allergies History reviewed. No pertinent surgical history.    MEDICATIONS: Current Outpatient Medications  Medication Instructions   cefdinir (OMNICEF) 600 mg, Oral, Daily   donepezil (ARICEPT) 10 MG tablet 1 tablet, Oral, Daily with breakfast   levothyroxine (SYNTHROID, LEVOTHROID) 50 MCG tablet 1 tablet, Oral, Daily   risperiDONE (RISPERDAL) 0.5 mg, Oral, 2 times daily   sertraline (ZOLOFT) 75 mg, Oral, Daily   vitamin B-12 (CYANOCOBALAMIN) 1000 MCG tablet 1 tablet, Oral, Daily   ED Course: Pt in Ed alert awake afebrile with O2 sats of 97% on room air Vitals:   11/18/22 1700 11/18/22 2012 11/18/22 2012 11/19/22 0037  BP: 108/82  110/68 106/65  Pulse: 62  63 63  Resp: 16  16 16   Temp:  99.8 F (37.7 C)    TempSrc:  Axillary    SpO2: 100%  97% 98%    Total I/O In: 100 [IV Piggyback:100] Out: -  SpO2: 98 % Blood work in ed shows: Leukocytosis of 13.4 anemia 11.4, sodium 138 calcium 8.4 normal kidney and liver function test. EKG shows sinus bradycardia at 59 with no ST-T wave changes.  Initial noncontrast CT showed small amount of subdural hemorrhage with no midline shift or skull  fracture. Patient's vitals within normal limit and is not on any prescribed or over-the-counter antiplatelet or anticoagulation therapy.  Repeat head CT 6 hours later shows a 3 cm intra parenchymal hemorrhage.  Case was discussed with neurosurgery on-call Dr. Lacinda Axon and recommended repeat CT imaging in the morning with blood pressure goal of less than [id number] systolic, CTA of the head to rule out any aneurysm, coagulation profile and antiepileptic therapy prophylactically.  Results for orders placed or performed during the hospital encounter of 11/18/22 (from the past 24 hour(s))  CBC with Differential     Status: Abnormal   Collection Time: 11/18/22  3:12 PM  Result Value Ref Range   WBC 13.4 (H) 4.0 - 10.5 K/uL   RBC 3.91 3.87 - 5.11 MIL/uL   Hemoglobin 11.4 (L) 12.0 - 15.0 g/dL   HCT 34.5 (L) 36.0 - 46.0 %   MCV 88.2 80.0 - 100.0 fL   MCH 29.2 26.0 - 34.0 pg   MCHC 33.0 30.0 - 36.0 g/dL   RDW 16.1 (H) 11.5 - 15.5 %   Platelets 249 150 - 400 K/uL   nRBC 0.0 0.0 - 0.2 %   Neutrophils Relative % 88 %   Neutro Abs 11.7 (H) 1.7 - 7.7 K/uL   Lymphocytes Relative 7 %   Lymphs Abs 0.9 0.7 - 4.0 K/uL   Monocytes Relative  5 %   Monocytes Absolute 0.7 0.1 - 1.0 K/uL   Eosinophils Relative 0 %   Eosinophils Absolute 0.0 0.0 - 0.5 K/uL   Basophils Relative 0 %   Basophils Absolute 0.1 0.0 - 0.1 K/uL   Immature Granulocytes 0 %   Abs Immature Granulocytes 0.05 0.00 - 0.07 K/uL  Basic metabolic panel     Status: Abnormal   Collection Time: 11/18/22  3:12 PM  Result Value Ref Range   Sodium 130 (L) 135 - 145 mmol/L   Potassium 3.5 3.5 - 5.1 mmol/L   Chloride 101 98 - 111 mmol/L   CO2 22 22 - 32 mmol/L   Glucose, Bld 88 70 - 99 mg/dL   BUN 16 8 - 23 mg/dL   Creatinine, Ser 0.59 0.44 - 1.00 mg/dL   Calcium 8.4 (L) 8.9 - 10.3 mg/dL   GFR, Estimated >60 >60 mL/min   Anion gap 7 5 - 15  Urinalysis, Routine w reflex microscopic -Urine, Clean Catch     Status: Abnormal   Collection Time: 11/18/22   3:41 PM  Result Value Ref Range   Color, Urine AMBER (A) YELLOW   APPearance CLOUDY (A) CLEAR   Specific Gravity, Urine 1.020 1.005 - 1.030   pH 5.0 5.0 - 8.0   Glucose, UA NEGATIVE NEGATIVE mg/dL   Hgb urine dipstick SMALL (A) NEGATIVE   Bilirubin Urine NEGATIVE NEGATIVE   Ketones, ur 20 (A) NEGATIVE mg/dL   Protein, ur NEGATIVE NEGATIVE mg/dL   Nitrite POSITIVE (A) NEGATIVE   Leukocytes,Ua MODERATE (A) NEGATIVE   RBC / HPF 0-5 0 - 5 RBC/hpf   WBC, UA 6-10 0 - 5 WBC/hpf   Bacteria, UA MANY (A) NONE SEEN   Squamous Epithelial / HPF 0-5 0 - 5 /HPF  Protime-INR     Status: Abnormal   Collection Time: 11/18/22 11:16 PM  Result Value Ref Range   Prothrombin Time 15.5 (H) 11.4 - 15.2 seconds   INR 1.2 0.8 - 1.2  APTT     Status: None   Collection Time: 11/18/22 11:16 PM  Result Value Ref Range   aPTT 33 24 - 36 seconds    Unresulted Labs (From admission, onward)     Start     Ordered   11/18/22 1920  Urine Culture  Add-on,   AD       Question:  Indication  Answer:  Altered mental status (if no other cause identified)   11/18/22 1919           Pt has received : Orders Placed This Encounter  Procedures   Critical Care    This order was created via procedure documentation    Standing Status:   Standing    Number of Occurrences:   1   Critical Care    This order was created via procedure documentation    Standing Status:   Standing    Number of Occurrences:   1   Urine Culture    Standing Status:   Standing    Number of Occurrences:   1    Order Specific Question:   Indication    Answer:   Altered mental status (if no other cause identified)   CT Head Wo Contrast    Standing Status:   Standing    Number of Occurrences:   1   CT Cervical Spine Wo Contrast    Standing Status:   Standing    Number of Occurrences:   1  DG Chest Portable 1 View    Standing Status:   Standing    Number of Occurrences:   1    Order Specific Question:   Reason for Exam (SYMPTOM  OR  DIAGNOSIS REQUIRED)    Answer:   Fall   CT Head Wo Contrast    Standing Status:   Standing    Number of Occurrences:   1   CT ANGIO HEAD W OR WO CONTRAST    Standing Status:   Standing    Number of Occurrences:   1    Order Specific Question:   Does the patient have a contrast media/X-ray dye allergy?    Answer:   No    Order Specific Question:   If indicated for the ordered procedure, I authorize the administration of contrast media per Radiology protocol    Answer:   Yes   CT HEAD WO CONTRAST    Standing Status:   Standing    Number of Occurrences:   1    Order Specific Question:   Radiology Contrast Protocol - do NOT remove file path    Answer:   [email or web address]   CBC with Differential    Standing Status:   Standing    Number of Occurrences:   1   Basic metabolic panel    Standing Status:   Standing    Number of Occurrences:   1   Urinalysis, Routine w reflex microscopic -Urine, Clean Catch    Standing Status:   Standing    Number of Occurrences:   1    Order Specific Question:   Specimen Source    Answer:   Urine, Clean Catch [76]   Protime-INR    Standing Status:   Standing    Number of Occurrences:   1   APTT    Standing Status:   Standing    Number of Occurrences:   1   Diet NPO time specified    Standing Status:   Standing    Number of Occurrences:   1   Neuro checks    Standing Status:   Standing    Number of Occurrences:   1   Vital signs every 2 hours x 12 hours, then every 4 hours    every 2 hours x 12 hours, then every 4 hours    Standing Status:   Standing    Number of Occurrences:   1   Notify physician (specify)    Standing Status:   Standing    Number of Occurrences:   [id number]    Order Specific Question:   Notify Physician    Answer:   change in neurologic status based on Neurologic Worsening protocol    Order Specific Question:   Notify Physician    Answer:   new onset dysrhythmia    Order Specific Question:    Notify Physician    Answer:   Systolic BP > [id number] (not controlled by PRN continuous infusion medications) or less than 100. Goal SBP 130-150    Order Specific Question:   Notify Physician    Answer:   Pox < 88% despite the use of supplemental O2 via nasal cannula    Order Specific Question:   Notify Physician    Answer:   Heart Rate > 120 or less than 50    Order Specific Question:   Notify Physician    Answer:   Respiratory Rate > 30    Order Specific Question:   Notify Physician  Answer:   Blood Glucose > 180mg /dl or less than 60mg /dl    Order Specific Question:   Notify Physician    Answer:   Temperature goal of 37 C (98.6 F). Acceptable range: 36.5 C - 37.5 C (97.7 F - 99.5 F)    Order Specific Question:   Notify Physician    Answer:   Urine output > 300 ml or less than 30 ml (unless anuric)   Elevate head of bed    Standing Status:   Standing    Number of Occurrences:   1    Order Specific Question:   Number of degrees    Answer:   30 Degrees   Swallow screen - If patient does NOT pass this screen, place order for SLP eval and treat (SLP2) - swallowing evaluation (BSE, MBS and/or diet order as indicated)    - If patient does NOT pass this screen, place order for SLP eval and treat (SLP2) - swallowing evaluation (BSE, MBS and/or diet order as indicated)    Standing Status:   Standing    Number of Occurrences:   1   RN to notify Physician for appropriate diet after patient passes swallow screen    after patient passes swallow screen    Standing Status:   Standing    Number of Occurrences:   1   NIH stroke scale    On arrival to unit, then q 1h    Standing Status:   Standing    Number of Occurrences:   1   Intake and output    Avoid use of indwelling catheter    Standing Status:   Standing    Number of Occurrences:   1   Apply Stroke Care Plan: Subarachnoid Hemorrhage, Nontraumatic    Standing Status:   Standing    Number of Occurrences:   1   Discuss with patient and  document patient's goals for stroke risk factor reduction    Standing Status:   Standing    Number of Occurrences:   1   Initiate Oral Care Protocol    Standing Status:   Standing    Number of Occurrences:   1   Initiate Carrier Fluid Protocol    Standing Status:   Standing    Number of Occurrences:   1   Provide stroke education material to patient and family    Standing Status:   Standing    Number of Occurrences:   1   Nurse to provide smoking / tobacco cessation education    Standing Status:   Standing    Number of Occurrences:   1   Hunt-Hess Score Hunt-Hess Score: 1- Asymptomatic, mild headache, slight nuchal rigidity The first Hunt-Hess performed within 6 hours of hospital arrival and prior to the initiation of any invasive intracranial procedure.    The first Hunt-Hess performed within 6 hours of hospital arrival and prior to the initiation of any invasive intracranial procedure.    Standing Status:   Standing    Number of Occurrences:   1    Order Specific Question:   Hunt-Hess Score    Answer:   1- Asymptomatic, mild headache, slight nuchal rigidity   Bed rest    Standing Status:   Standing    Number of Occurrences:   1   Do not attempt resuscitation (DNR)    Standing Status:   Standing    Number of Occurrences:   1    Order Specific Question:  If patient has no pulse and is not breathing    Answer:   Do Not Attempt Resuscitation    Order Specific Question:   If patient has a pulse and/or is breathing: Medical Treatment Goals    Answer:   LIMITED ADDITIONAL INTERVENTIONS: Use medication/IV fluids and cardiac monitoring as indicated; Do not use intubation or mechanical ventilation (DNI), also provide comfort medications.  Transfer to Progressive/Stepdown as indicated, avoid Intensive Care.    Order Specific Question:   Consent:    Answer:   Discussion documented in EHR or advanced directives reviewed   Consult to hospitalist    Standing Status:   Standing    Number of  Occurrences:   1    Order Specific Question:   Place call to:    Answer:   [id number]    Order Specific Question:   Reason for Consult    Answer:   Admit    Order Specific Question:   Diagnosis/Clinical Info for Consult:    Answer:   Fall, enlarging subarachnoid and increasing intraparenchymal hematoma.  Neurosurgery advised non-surgical management, Keppra, BP management, repeat CT in the morning   Consult to neurosurgery Consult Timeframe: ROUTINE - requires response within 24 hours; Reason for Consult? SAH/SDH/IPH.    Standing Status:   Standing    Number of Occurrences:   1    Order Specific Question:   Consult Timeframe    Answer:   ROUTINE - requires response within 24 hours    Order Specific Question:   Reason for Consult?    Answer:   SAH/SDH/IPH.   Pulse oximetry, continuous    Standing Status:   Standing    Number of Occurrences:   1   Oxygen therapy Mode or (Route): Nasal cannula; Liters Per Minute: 2; Keep 02 saturation: > 92%    Standing Status:   Standing    Number of Occurrences:   1    Order Specific Question:   Mode or (Route)    Answer:   Nasal cannula    Order Specific Question:   Liters Per Minute    Answer:   2    Order Specific Question:   Keep 02 saturation    Answer:   > 92%   SLP eval and treat Reason for evaluation: Cognitive/Language evaluation    Standing Status:   Standing    Number of Occurrences:   1    Order Specific Question:   Reason for evaluation    Answer:   Cognitive/Language evaluation   ED EKG    Standing Status:   Standing    Number of Occurrences:   1    Order Specific Question:   Reason for Exam    Answer:   Other (See Comments)   Seizure precautions    Standing Status:   Standing    Number of Occurrences:   1   Fall precautions    Standing Status:   Standing    Number of Occurrences:   1   Aspiration precautions    Standing Status:   Standing    Number of Occurrences:   1   Fall precautions    Standing Status:   Standing    Number  of Occurrences:   1   Seizure precautions    Standing Status:   Standing    Number of Occurrences:   1   Aspiration precautions    Standing Status:   Standing    Number of Occurrences:   1  Meds ordered this encounter  Medications   DISCONTD: cefdinir (OMNICEF) capsule 600 mg   DISCONTD: cefdinir (OMNICEF) 300 MG capsule    Sig: Take 2 capsules (600 mg total) by mouth daily for 7 days.    Dispense:  14 capsule    Refill:  0   cefdinir (OMNICEF) 250 MG/5ML suspension 600 mg   cefdinir (OMNICEF) 250 MG/5ML suspension    Sig: Take 12 mLs (600 mg total) by mouth daily for 7 days.    Dispense:  84 mL    Refill:  0   levETIRAcetam (KEPPRA) IVPB 500 mg/100 mL premix   iohexol (OMNIPAQUE) 350 MG/ML injection 75 mL   levothyroxine (SYNTHROID) tablet 50 mcg   sertraline (ZOLOFT) tablet 75 mg   risperiDONE (RISPERDAL) tablet 0.5 mg   cyanocobalamin (VITAMIN B12) tablet 1,000 mcg   donepezil (ARICEPT) tablet 10 mg   levETIRAcetam (KEPPRA) IVPB 500 mg/100 mL premix    stroke: early stages of recovery book   0.9 %  sodium chloride infusion   cefTRIAXone (ROCEPHIN) 1 g in sodium chloride 0.9 % 100 mL IVPB    Order Specific Question:   Antibiotic Indication:    Answer:   UTI     Admission Imaging : CT Head Wo Contrast Result Date: 11/18/2022 CLINICAL DATA:  Follow-up intracranial hemorrhage EXAM: CT HEAD WITHOUT CONTRAST TECHNIQUE: Contiguous axial images were obtained from the base of the skull through the vertex without intravenous contrast. RADIATION DOSE REDUCTION: This exam was performed according to the departmental dose-optimization program which includes automated exposure control, adjustment of the mA and/or kV according to patient size and/or use of iterative reconstruction technique. COMPARISON:  CT brain 11/18/2022, 01/15/2019 FINDINGS: Brain: Interval development of acute intraparenchymal hemorrhage within the right frontal operculum measuring 3.2 x 2.4 by 2.1 cm, estimated volume  of 8.1 cc. Moderate surrounding edema. No measurable midline shift at this time. Slight interval increase in small volume subarachnoid blood over the right frontal and temporal lobes. Possible small focus of cortical contusion right temporal lobe, series 2, image 10. Small volume acute hemorrhage in the right sylvian fissure as before. Stable ventricle size. Atrophy and chronic small vessel ischemic changes of the white matter. Vascular: No hyperdense vessels.  Carotid vascular calcification Skull: Normal. Negative for fracture or focal lesion. Sinuses/Orbits: No acute finding. Mucosal thickening in the sinuses. Other: Right supraorbital scalp swelling. Left posterior scalp laceration and hematoma.  IMPRESSION:  1. Interval development of acute intraparenchymal hemorrhage within the right frontal operculum measuring up to 3.2 cm. Moderate surrounding edema but no measurable midline shift at this time. Slight interval increase in small volume subarachnoid blood over the right frontal and temporal lobes. Possible small focus of cortical contusion right temporal lobe.  2. Atrophy and chronic small vessel ischemic changes of the white matter.  Critical Value/emergent results were called by telephone at the time of interpretation on 11/18/2022 at 9:44 pm to provider Roderic Palau in ED , who verbally acknowledged these results. Electronically Signed   By: Donavan Foil M.D.   On: 11/18/2022 21:44   CT Cervical Spine Wo Contrast Result Date: 11/18/2022 CLINICAL DATA:  Fall with neck pain. EXAM: CT CERVICAL SPINE WITHOUT CONTRAST TECHNIQUE: Multidetector CT imaging of the cervical spine was performed without intravenous contrast. Multiplanar CT image reconstructions were also generated. RADIATION DOSE REDUCTION: This exam was performed according to the departmental dose-optimization program which includes automated exposure control, adjustment of the mA and/or kV according to patient size and/or use of  iterative  reconstruction technique. COMPARISON:  Cervical spine radiographs dated 10/28/2021. FINDINGS: Alignment: Gentle kyphosis at C4-5 is likely positional or degenerative. No traumatic subluxation. Skull base and vertebrae: No acute fracture. No primary bone lesion or focal pathologic process. Soft tissues and spinal canal: No prevertebral fluid or swelling. No visible canal hematoma. Disc levels:  Multilevel degenerative disc and joint disease. Upper chest: Negative. Other: None.  IMPRESSION: 1. No acute osseous injury of the cervical spine.  Electronically Signed   By: Zerita Boers M.D.   On: 11/18/2022 16:23   DG Chest Portable 1 View Result Date: 11/18/2022 CLINICAL DATA:  Fall EXAM: PORTABLE CHEST 1 VIEW COMPARISON:  01/15/2019 FINDINGS: No acute airspace disease or pleural effusion. Normal cardiomediastinal silhouette with aortic atherosclerosis. Negative for pneumothorax. IMPRESSION: No active disease. Electronically Signed   By: Donavan Foil M.D.   On: 11/18/2022 15:53   CT Head Wo Contrast Result Date: 11/18/2022 CLINICAL DATA:  Fall EXAM: CT HEAD WITHOUT CONTRAST TECHNIQUE: Multidetector CT imaging of the head spine was performed following the standard protocol without intravenous contrast. RADIATION DOSE REDUCTION: This exam was performed according to the departmental dose-optimization program which includes automated exposure control, adjustment of the mA and/or kV according to patient size and/or use of iterative reconstruction technique. COMPARISON:  01/15/2019 FINDINGS: CT HEAD FINDINGS Brain: Small volume subdural and subarachnoid hemorrhage about the right frontal pole and sylvian fissure (series 2, image 8). No evidence of acute infarction, hydrocephalus, extra-axial collection or mass lesion/mass effect. Periventricular and deep white matter hypodensity. Vascular: No hyperdense vessel or unexpected calcification. Skull: Normal. Negative for fracture or focal lesion. Sinuses/Orbits: No acute  finding. Other: Soft tissue contusion and laceration of the left scalp vertex (series 2, image 18).  IMPRESSION:  1. Small volume subdural and subarachnoid hemorrhage about the right frontal pole and Sylvian fissure. No overlying skull fracture.  2. Soft tissue contusion and laceration of the left scalp vertex. These results were called by telephone at the time of interpretation on 11/18/2022 at 3:09 pm to Dr Blake Divine , who verbally acknowledged these results. Electronically Signed   By: Delanna Ahmadi M.D.   On: 11/18/2022 15:11   Physical Examination: Vitals:   11/18/22 1700 11/18/22 2012 11/18/22 2012 11/19/22 0037  BP: 108/82  110/68 106/65  Pulse: 62  63 63  Temp:  99.8 F (37.7 C)    Resp: 16  16 16   SpO2: 100%  97% 98%  TempSrc:  Axillary     Physical Exam Vitals and nursing note reviewed.  Constitutional:      General: She is not in acute distress.    Appearance: She is not ill-appearing, toxic-appearing or diaphoretic.  HENT:     Head: Laceration present.     Right Ear: Hearing and external ear normal.     Left Ear: Hearing and external ear normal.     Nose: Nose normal. No nasal deformity.     Mouth/Throat:     Lips: Pink.     Mouth: Mucous membranes are moist.     Tongue: No lesions.     Pharynx: Oropharynx is clear.  Eyes:     Extraocular Movements: Extraocular movements intact.  Cardiovascular:     Rate and Rhythm: Normal rate and regular rhythm.     Heart sounds: Normal heart sounds.  Pulmonary:     Effort: Pulmonary effort is normal.     Breath sounds: Normal breath sounds.  Abdominal:     General: Bowel sounds  are normal. There is no distension.     Palpations: Abdomen is soft. There is no mass.     Tenderness: There is no abdominal tenderness. There is no guarding.     Hernia: No hernia is present.  Musculoskeletal:     Right lower leg: No edema.     Left lower leg: No edema.  Skin:    General: Skin is warm.  Neurological:     Comments:  Awake Does not open her eyes lets me examine her. Leg are flexed and contracted.     Psychiatric:        Speech: She is noncommunicative.        Behavior: Behavior is withdrawn.       Assessment and Plan: UTI (urinary tract infection) Urinalysis    Component Value Date/Time   COLORURINE AMBER (A) 11/18/2022 1541   APPEARANCEUR CLOUDY (A) 11/18/2022 1541   LABSPEC 1.020 11/18/2022 1541   PHURINE 5.0 11/18/2022 1541   GLUCOSEU NEGATIVE 11/18/2022 1541   HGBUR SMALL (A) 11/18/2022 1541   BILIRUBINUR NEGATIVE 11/18/2022 1541   KETONESUR 20 (A) 11/18/2022 1541   PROTEINUR NEGATIVE 11/18/2022 1541   NITRITE POSITIVE (A) 11/18/2022 1541   LEUKOCYTESUR MODERATE (A) 11/18/2022 1541  We will cont pt on rocephin follow C/S.    Intracerebral hemorrhage (HCC) Pt presenting with traumatic IPH/SDH/SAH and neurosurgery on board with below recommendation: SBP < 157mm Hg Confirm normal coagulation profile.  Keppra 750mg  BID Will admit to med tele. Pt is DNR in event bleed worsens. Neuro check.  NS consult. Aspiration/ fall / seizure precaution. CTA head shows: IMPRESSION: 1. No intracranial arterial occlusion or hemodynamically significant stenosis. 2. Unchanged appearance of right frontal operculum intraparenchymal hematoma and small volume right convexity subarachnoid blood. Repeat head ct in am .   Hypothyroidism Bedside swallow.  Cont levothyroxine 50 mcg.   Dementia ([place]) Cont Aricept / risperidone and zokoft.    DVT prophylaxis:  SCD's.  Code Status:  DNR.   Family Communication:  SON.   Disposition Plan:  SNF.   Consults called:  Neurosurgery Dr. Lacinda Axon.   Admission status: Inpatient.    Unit/ Expected LOS: Med Tele. / 2 days.    Para Skeans MD [facility]  6 PM- 2 AM. Please contact me via secure Chat 6 PM-2 AM. [phone]( Pager ) To contact the [facility] Attending or Consulting provider [place] or covering provider during after hours [place], for this patient.   Check the care team in [facility] and look for a) attending/consulting [facility] provider listed and b) the [facility] team listed Log into [email or web address] and use [place]'s universal password to access. If you do not have the password, please contact the hospital operator. Locate the [facility] provider you are looking for under [facility] and page to a number that you can be directly reached. If you still have difficulty reaching the provider, please page the [facility] (Director on Call) for the Hospitalists listed on amion for assistance. [email or web address] 11/19/2022, 12:43 AM

## 2022-11-18 NOTE — Assessment & Plan Note (Addendum)
Pt presenting with traumatic IPH/SDH/SAH and neurosurgery on board with below recommendation: SBP < 138mm Hg Confirm normal coagulation profile.  Keppra 750mg  BID Will admit to med tele. Pt is DNR in event bleed worsens. Neuro check.  NS consult. Aspiration/ fall / seizure precaution. CTA head shows: IMPRESSION: 1. No intracranial arterial occlusion or hemodynamically significant stenosis. 2. Unchanged appearance of right frontal operculum intraparenchymal hematoma and small volume right convexity subarachnoid blood. Repeat head ct in am .

## 2022-11-18 NOTE — Progress Notes (Signed)
Neurosurgery consulted for patient with advanced dementia and fall. Initial CT of the head shows small amount of SAH and SDH. Her BP is normal and she is on no anticoagulation. Her repeat scan does show a new 3cm IPH which is also likely traumatic in nature. Given this new finding, she will need to be observed as inpatient  Recommend SBP < 161mm Hg CTA of the head to rule out aneurysm Confirm normal coagulation profile.  Keppra 750mg  BID  I have attempted to contact sons vis numbers in chart but unable to contact.  Would hihgly recommending confirming if patient is DNR/DNI as this would help guide management should the [place] progressed. I do think surgery would be high risk for her but would need to confirm with family, patient care goals.

## 2022-11-19 ENCOUNTER — Inpatient Hospital Stay: Payer: Medicare Other

## 2022-11-19 DIAGNOSIS — Z1612 Extended spectrum beta lactamase (ESBL) resistance: Secondary | ICD-10-CM | POA: Diagnosis present

## 2022-11-19 DIAGNOSIS — S06340A Traumatic hemorrhage of right cerebrum without loss of consciousness, initial encounter: Secondary | ICD-10-CM | POA: Diagnosis not present

## 2022-11-19 DIAGNOSIS — W109XXA Fall (on) (from) unspecified stairs and steps, initial encounter: Secondary | ICD-10-CM | POA: Diagnosis present

## 2022-11-19 DIAGNOSIS — E43 Unspecified severe protein-calorie malnutrition: Secondary | ICD-10-CM | POA: Diagnosis present

## 2022-11-19 DIAGNOSIS — S065X0A Traumatic subdural hemorrhage without loss of consciousness, initial encounter: Secondary | ICD-10-CM | POA: Diagnosis present

## 2022-11-19 DIAGNOSIS — Y92129 Unspecified place in nursing home as the place of occurrence of the external cause: Secondary | ICD-10-CM | POA: Diagnosis not present

## 2022-11-19 DIAGNOSIS — L03811 Cellulitis of head [any part, except face]: Secondary | ICD-10-CM | POA: Diagnosis present

## 2022-11-19 DIAGNOSIS — N390 Urinary tract infection, site not specified: Secondary | ICD-10-CM | POA: Diagnosis present

## 2022-11-19 DIAGNOSIS — E871 Hypo-osmolality and hyponatremia: Secondary | ICD-10-CM | POA: Diagnosis present

## 2022-11-19 DIAGNOSIS — Z66 Do not resuscitate: Secondary | ICD-10-CM | POA: Diagnosis present

## 2022-11-19 DIAGNOSIS — Z681 Body mass index (BMI) 19 or less, adult: Secondary | ICD-10-CM | POA: Diagnosis not present

## 2022-11-19 DIAGNOSIS — B9620 Unspecified Escherichia coli [E. coli] as the cause of diseases classified elsewhere: Secondary | ICD-10-CM | POA: Diagnosis present

## 2022-11-19 DIAGNOSIS — Z7189 Other specified counseling: Secondary | ICD-10-CM | POA: Diagnosis not present

## 2022-11-19 DIAGNOSIS — Z79899 Other long term (current) drug therapy: Secondary | ICD-10-CM | POA: Diagnosis not present

## 2022-11-19 DIAGNOSIS — Z79890 Hormone replacement therapy (postmenopausal): Secondary | ICD-10-CM | POA: Diagnosis not present

## 2022-11-19 DIAGNOSIS — R402222 Coma scale, best verbal response, incomprehensible words, at arrival to emergency department: Secondary | ICD-10-CM | POA: Diagnosis present

## 2022-11-19 DIAGNOSIS — G309 Alzheimer's disease, unspecified: Secondary | ICD-10-CM | POA: Diagnosis present

## 2022-11-19 DIAGNOSIS — E039 Hypothyroidism, unspecified: Secondary | ICD-10-CM | POA: Diagnosis present

## 2022-11-19 DIAGNOSIS — S066X0A Traumatic subarachnoid hemorrhage without loss of consciousness, initial encounter: Secondary | ICD-10-CM | POA: Diagnosis present

## 2022-11-19 DIAGNOSIS — Z87891 Personal history of nicotine dependence: Secondary | ICD-10-CM | POA: Diagnosis not present

## 2022-11-19 DIAGNOSIS — E872 Acidosis, unspecified: Secondary | ICD-10-CM | POA: Diagnosis present

## 2022-11-19 DIAGNOSIS — F0283 Dementia in other diseases classified elsewhere, unspecified severity, with mood disturbance: Secondary | ICD-10-CM | POA: Diagnosis present

## 2022-11-19 DIAGNOSIS — E538 Deficiency of other specified B group vitamins: Secondary | ICD-10-CM | POA: Diagnosis present

## 2022-11-19 DIAGNOSIS — R402342 Coma scale, best motor response, flexion withdrawal, at arrival to emergency department: Secondary | ICD-10-CM | POA: Diagnosis present

## 2022-11-19 DIAGNOSIS — I619 Nontraumatic intracerebral hemorrhage, unspecified: Secondary | ICD-10-CM | POA: Diagnosis present

## 2022-11-19 DIAGNOSIS — E876 Hypokalemia: Secondary | ICD-10-CM | POA: Diagnosis present

## 2022-11-19 DIAGNOSIS — Z515 Encounter for palliative care: Secondary | ICD-10-CM | POA: Diagnosis not present

## 2022-11-19 DIAGNOSIS — S0003XA Contusion of scalp, initial encounter: Secondary | ICD-10-CM | POA: Diagnosis not present

## 2022-11-19 LAB — VITAMIN B12: Vitamin B-12: 176 pg/mL — ABNORMAL LOW (ref 180–914)

## 2022-11-19 LAB — BASIC METABOLIC PANEL WITH GFR
Anion gap: 4 — ABNORMAL LOW (ref 5–15)
BUN: 13 mg/dL (ref 8–23)
CO2: 23 mmol/L (ref 22–32)
Calcium: 8.1 mg/dL — ABNORMAL LOW (ref 8.9–10.3)
Chloride: 102 mmol/L (ref 98–111)
Creatinine, Ser: 0.61 mg/dL (ref 0.44–1.00)
GFR, Estimated: >60 mL/min (ref >60)
Glucose, Bld: 92 mg/dL (ref 70–99)
Potassium: 3.7 mmol/L (ref 3.5–5.1)
Sodium: 129 mmol/L — ABNORMAL LOW (ref 135–145)

## 2022-11-19 LAB — MAGNESIUM: Magnesium: 2 mg/dL (ref 1.7–2.4)

## 2022-11-19 LAB — CBC
HCT: 35.1 % — ABNORMAL LOW (ref 36.0–46.0)
Hemoglobin: 11.5 g/dL — ABNORMAL LOW (ref 12.0–15.0)
MCH: 29.1 pg (ref 26.0–34.0)
MCHC: 32.8 g/dL (ref 30.0–36.0)
MCV: 88.9 fL (ref 80.0–100.0)
Platelets: 260 10*3/uL (ref 150–400)
RBC: 3.95 MIL/uL (ref 3.87–5.11)
RDW: 16 % — ABNORMAL HIGH (ref 11.5–15.5)
WBC: 10.1 10*3/uL (ref 4.0–10.5)
nRBC: 0 % (ref 0.0–0.2)

## 2022-11-19 LAB — VITAMIN D 25 HYDROXY (VIT D DEFICIENCY, FRACTURES): Vit D, 25-Hydroxy: 148.05 ng/mL — ABNORMAL HIGH (ref 30–100)

## 2022-11-19 LAB — OSMOLALITY: Osmolality: 282 mosm/kg (ref 275–295)

## 2022-11-19 LAB — PHOSPHORUS: Phosphorus: 2.9 mg/dL (ref 2.5–4.6)

## 2022-11-19 MED ORDER — DONEPEZIL HCL 5 MG PO TABS: 10.0000 mg | ORAL_TABLET | Freq: Every day | ORAL | Status: DC

## 2022-11-19 MED ORDER — SODIUM CHLORIDE 0.9 % IV SOLN
1.0000 g | INTRAVENOUS | Status: DC
  Administered 2022-11-19 – 2022-11-21 (×3): 1 g via INTRAVENOUS
  Filled 2022-11-19: qty 1
  Filled 2022-11-19 (×2): qty 10

## 2022-11-19 MED ORDER — ACETAMINOPHEN 325 MG PO TABS: 650.0000 mg | ORAL_TABLET | Freq: Four times a day (QID) | ORAL | Status: DC | PRN

## 2022-11-19 MED ORDER — STROKE: EARLY STAGES OF RECOVERY BOOK: Freq: Once | Status: DC

## 2022-11-19 MED ORDER — LEVOTHYROXINE SODIUM 50 MCG PO TABS: 50.0000 ug | ORAL_TABLET | Freq: Every day | ORAL | Status: DC

## 2022-11-19 MED ORDER — SODIUM CHLORIDE 0.9 % IV SOLN
750.0000 mg | Freq: Two times a day (BID) | INTRAVENOUS | Status: DC
  Administered 2022-11-19 – 2022-11-20 (×4): 750 mg via INTRAVENOUS
  Filled 2022-11-19: qty 7.5
  Filled 2022-11-19: qty 5
  Filled 2022-11-19 (×3): qty 7.5

## 2022-11-19 MED ORDER — SERTRALINE HCL 50 MG PO TABS: 75.0000 mg | ORAL_TABLET | Freq: Every day | ORAL | Status: DC

## 2022-11-19 MED ORDER — ACETAMINOPHEN 650 MG RE SUPP
650.0000 mg | Freq: Four times a day (QID) | RECTAL | Status: DC | PRN
  Administered 2022-11-21: 650 mg via RECTAL
  Filled 2022-11-19: qty 1

## 2022-11-19 MED ORDER — LEVETIRACETAM IN NACL 500 MG/100ML IV SOLN
500.0000 mg | Freq: Two times a day (BID) | INTRAVENOUS | Status: DC
  Filled 2022-11-19: qty 100

## 2022-11-19 MED ORDER — RISPERIDONE 0.5 MG PO TABS
0.5000 mg | ORAL_TABLET | Freq: Two times a day (BID) | ORAL | Status: DC
  Filled 2022-11-19: qty 1

## 2022-11-19 MED ORDER — HYDROCODONE-ACETAMINOPHEN 5-325 MG PO TABS: 1.0000 | ORAL_TABLET | ORAL | Status: DC | PRN

## 2022-11-19 MED ORDER — SODIUM CHLORIDE 0.9% FLUSH
3.0000 mL | Freq: Two times a day (BID) | INTRAVENOUS | Status: DC
  Administered 2022-11-19 – 2022-11-22 (×6): 3 mL via INTRAVENOUS

## 2022-11-19 MED ORDER — MORPHINE SULFATE (PF) 2 MG/ML IV SOLN: 2.0000 mg | INTRAVENOUS | Status: DC | PRN

## 2022-11-19 MED ORDER — SODIUM CHLORIDE 0.9 % IV SOLN: INTRAVENOUS | Status: AC

## 2022-11-19 MED ORDER — VITAMIN B-12 1000 MCG PO TABS: 1000.0000 ug | ORAL_TABLET | Freq: Every day | ORAL | Status: DC

## 2022-11-19 NOTE — Progress Notes (Signed)
OT Cancellation Note  Patient Details Name: Mandy Reyes MRN: [id number] DOB: 1941-07-13   Cancelled Treatment:    Reason Eval/Treat Not Completed: Medical issues which prohibited therapy. OT orders received, chart reviewed. Per chart review, pt with a recent NIHSS score of 24. Unable to follow commands or verbalize needs at this time per nursing note. Will hold until pt medically appropriate and able to meaningfully participate in therapy.  Doneta Public 11/19/2022, 8:27 AM

## 2022-11-19 NOTE — Evaluation (Signed)
Physical Therapy Evaluation Patient Details Name: Mandy Reyes MRN: [id number] DOB: March 29, 1941 Today's Date: 11/19/2022  History of Present Illness  Mandy Reyes is an 82 y.o. female  seen today after fall . Pt has dementia but is ambulatory at baseline and was wandering outside and fall and hit her head with laceration to back of her head and hematoma on right side of forehead. Fall was down 2 steps.  Clinical Impression  Patient has baseline dementia, however was ambulatory prior to admission. At this time she is requiring total assist for bed mobility. We will see patient on trial basis to see if she improves. Continue to follow.        Recommendations for follow up therapy are one component of a multi-disciplinary discharge planning process, led by the attending physician.  Recommendations may be updated based on patient status, additional functional criteria and insurance authorization.  Follow Up Recommendations Long-term institutional care without follow-up therapy Can patient physically be transported by private vehicle: No    Assistance Recommended at Discharge Frequent or constant Supervision/Assistance  Patient can return home with the following  A lot of help with walking and/or transfers;A lot of help with bathing/dressing/bathroom;Assist for transportation;Direct supervision/assist for medications management    Equipment Recommendations None recommended by PT  Recommendations for Other Services       Functional Status Assessment Patient has had a recent decline in their functional status and/or demonstrates limited ability to make significant improvements in function in a reasonable and predictable amount of time     Precautions / Restrictions Precautions Precautions: Fall Restrictions Weight Bearing Restrictions: No      Mobility  Bed Mobility Overal bed mobility: Needs Assistance Bed Mobility: Rolling Rolling: Total assist         General bed mobility  comments: patient curled up in bed sideways. Required total assist for repositioning in bed.    Transfers                   General transfer comment: not attempted    Ambulation/Gait               General Gait Details: not attempted  Stairs            Wheelchair Mobility    Modified Rankin (Stroke Patients Only)       Balance                                             Pertinent Vitals/Pain Pain Assessment Breathing: normal Negative Vocalization: none Facial Expression: smiling or inexpressive Body Language: relaxed    Home Living Family/patient expects to be discharged to:: Other (Comment) (long term care)                        Prior Function Prior Level of Function : Patient poor historian/Family not available             Mobility Comments: patient is unable to respond to questions, however apparently she was ambulatory prior to admission ADLs Comments: I would imagine she requires assistance at baseline     Hand Dominance        Extremity/Trunk Assessment   Upper Extremity Assessment Upper Extremity Assessment: Difficult to assess due to impaired cognition    Lower Extremity Assessment Lower Extremity Assessment: Difficult to assess due to impaired  cognition       Communication   Communication: Other (comment) (not verbalizing at this time)  Cognition Arousal/Alertness: Lethargic Behavior During Therapy: Flat affect Overall Cognitive Status: No family/caregiver present to determine baseline cognitive functioning                                 General Comments: patient with history of alzheimers dementia.        General Comments      Exercises     Assessment/Plan    PT Assessment Patient needs continued PT services  PT Problem List Decreased strength;Decreased mobility;Decreased balance;Decreased activity tolerance;Decreased safety awareness;Decreased cognition        PT Treatment Interventions Gait training;Functional mobility training;Therapeutic activities    PT Goals (Current goals can be found in the Care Plan section)  Acute Rehab PT Goals PT Goal Formulation: Patient unable to participate in goal setting Time For Goal Achievement: 12/03/22 Potential to Achieve Goals: Fair    Frequency Min 2X/week     Co-evaluation PT/OT/SLP Co-Evaluation/Treatment: Yes Reason for Co-Treatment: To address functional/ADL transfers;Necessary to address cognition/behavior during functional activity;For patient/therapist safety PT goals addressed during session: Mobility/safety with mobility         AM-PAC PT "6 Clicks" Mobility  Outcome Measure Help needed turning from your back to your side while in a flat bed without using bedrails?: Total Help needed moving from lying on your back to sitting on the side of a flat bed without using bedrails?: Total Help needed moving to and from a bed to a chair (including a wheelchair)?: Total Help needed standing up from a chair using your arms (e.g., wheelchair or bedside chair)?: Total Help needed to walk in hospital room?: Total Help needed climbing 3-5 steps with a railing? : Total 6 Click Score: 6    End of Session   Activity Tolerance: Other (comment) (patient limited by cognition) Patient left: in bed;with bed alarm set Nurse Communication: Mobility status PT Visit Diagnosis: Muscle weakness (generalized) (M62.81);Difficulty in walking, not elsewhere classified (R26.2);Other abnormalities of gait and mobility (R26.89);History of falling (Z91.81)    Time: [id number] PT Time Calculation (min) (ACUTE ONLY): 8 min   Charges:   PT Evaluation $PT Eval Low Complexity: 1 Low          Nikkia Devoss, PT, GCS 11/19/22,12:07 PM

## 2022-11-19 NOTE — Progress Notes (Addendum)
SLP Cancellation Note  Patient Details Name: SEYLA SORG MRN: [id number] DOB: [birth year]   Cancelled treatment:       Reason Eval/Treat Not Completed: Medical issues which prohibited therapy. Per chart review, pt mental status/LOA ("unresponsive" per nursing documentation; most recent NIHSS = 24) not supportive of cognitive-linguistic evaluation at this time. Will continue efforts as appropriate.  Cherrie Gauze, M.S., [facility] [phone] [place])  Quintella Baton 11/19/2022, 7:55 AM

## 2022-11-19 NOTE — ED Notes (Signed)
Advised nurse that patient has assigned bed 

## 2022-11-19 NOTE — Consult Note (Signed)
Neurosurgery-New Consultation Evaluation 11/19/2022 MAHROSH POVEROMO [id number]  Identifying Statement: Mandy Reyes is a 82 y.o. female from [place] [address] with fall  Physician Requesting Consultation: [facility]  History of Present Illness: Mandy Reyes is here for evaluation of the emergency department after a fall with resulting head injury.  She does have history of dementia and is cared for in a home.  She had a witnessed fall where she struck her head.  On arrival to the ED, she was told to be at her baseline but CT scan of the head revealed a small amount of hemorrhage.  On repeat scan of the CT, there is noted increase hematoma with an IPH.  Given this, the patient was started on Keppra and monitor overnight.  This morning, she does arouse but is confused at baseline.  She is not able to answer any orientation questions or give any history.  Past Medical History:  Past Medical History:  Diagnosis Date   Alzheimer's dementia [facility])     Social History: Social History   Socioeconomic History   Marital status: Single    Spouse name: Not on file   Number of children: Not on file   Years of education: Not on file   Highest education level: Not on file  Occupational History   Not on file  Tobacco Use   Smoking status: Former   Smokeless tobacco: Never  Vaping Use   Vaping Use: Never used  Substance and Sexual Activity   Alcohol use: No   Drug use: No   Sexual activity: Not on file  Other Topics Concern   Not on file  Social History Narrative   Not on file   Social Determinants of Health   Financial Resource Strain: Not on file  Food Insecurity: Not on file  Transportation Needs: Not on file  Physical Activity: Not on file  Stress: Not on file  Social Connections: Not on file  Intimate Partner Violence: Not on file    Family History: History reviewed. No pertinent family history.  Review of Systems:  Review of Systems -unable to obtain given  patient's history of dementia  Physical Exam: BP (!) 122/55   Pulse (!) 53   Temp 98.1 F (36.7 C) (Axillary)   Resp 15   SpO2 97%  There is no height or weight on file to calculate BMI. There is no height or weight on file to calculate BSA. General appearance: Awakens from sleep Head: Normocephalic, obvious trauma to the right side of the face  Neurologic exam:  Mental status: alertness: alert, orientation: Does not answer orientation questions  Speech: Speaks incomprehensible phrases, does answer yes or no but inappropriately Cranial nerves:  Unable to test visual fields, patient would not participate in cranial nerve exam Motor: Squeezes finger with bilateral hand, moves all extremities spontaneously and to noxious stimuli Gait: Not tested  Laboratory: Results for orders placed or performed during the hospital encounter of 11/18/22  CBC with Differential  Result Value Ref Range   WBC 13.4 (H) 4.0 - 10.5 K/uL   RBC 3.91 3.87 - 5.11 MIL/uL   Hemoglobin 11.4 (L) 12.0 - 15.0 g/dL   HCT 34.5 (L) 36.0 - 46.0 %   MCV 88.2 80.0 - 100.0 fL   MCH 29.2 26.0 - 34.0 pg   MCHC 33.0 30.0 - 36.0 g/dL   RDW 16.1 (H) 11.5 - 15.5 %   Platelets 249 150 - 400 K/uL   nRBC 0.0 0.0 -  0.2 %   Neutrophils Relative % 88 %   Neutro Abs 11.7 (H) 1.7 - 7.7 K/uL   Lymphocytes Relative 7 %   Lymphs Abs 0.9 0.7 - 4.0 K/uL   Monocytes Relative 5 %   Monocytes Absolute 0.7 0.1 - 1.0 K/uL   Eosinophils Relative 0 %   Eosinophils Absolute 0.0 0.0 - 0.5 K/uL   Basophils Relative 0 %   Basophils Absolute 0.1 0.0 - 0.1 K/uL   Immature Granulocytes 0 %   Abs Immature Granulocytes 0.05 0.00 - 0.07 K/uL  Basic metabolic panel  Result Value Ref Range   Sodium 130 (L) 135 - 145 mmol/L   Potassium 3.5 3.5 - 5.1 mmol/L   Chloride 101 98 - 111 mmol/L   CO2 22 22 - 32 mmol/L   Glucose, Bld 88 70 - 99 mg/dL   BUN 16 8 - 23 mg/dL   Creatinine, Ser 0.59 0.44 - 1.00 mg/dL   Calcium 8.4 (L) 8.9 - 10.3 mg/dL    GFR, Estimated >60 >60 mL/min   Anion gap 7 5 - 15  Urinalysis, Routine w reflex microscopic -Urine, Clean Catch  Result Value Ref Range   Color, Urine AMBER (A) YELLOW   APPearance CLOUDY (A) CLEAR   Specific Gravity, Urine 1.020 1.005 - 1.030   pH 5.0 5.0 - 8.0   Glucose, UA NEGATIVE NEGATIVE mg/dL   Hgb urine dipstick SMALL (A) NEGATIVE   Bilirubin Urine NEGATIVE NEGATIVE   Ketones, ur 20 (A) NEGATIVE mg/dL   Protein, ur NEGATIVE NEGATIVE mg/dL   Nitrite POSITIVE (A) NEGATIVE   Leukocytes,Ua MODERATE (A) NEGATIVE   RBC / HPF 0-5 0 - 5 RBC/hpf   WBC, UA 6-10 0 - 5 WBC/hpf   Bacteria, UA MANY (A) NONE SEEN   Squamous Epithelial / HPF 0-5 0 - 5 /HPF  Protime-INR  Result Value Ref Range   Prothrombin Time 15.5 (H) 11.4 - 15.2 seconds   INR 1.2 0.8 - 1.2  APTT  Result Value Ref Range   aPTT 33 24 - 36 seconds  CBC  Result Value Ref Range   WBC 10.1 4.0 - 10.5 K/uL   RBC 3.95 3.87 - 5.11 MIL/uL   Hemoglobin 11.5 (L) 12.0 - 15.0 g/dL   HCT 35.1 (L) 36.0 - 46.0 %   MCV 88.9 80.0 - 100.0 fL   MCH 29.1 26.0 - 34.0 pg   MCHC 32.8 30.0 - 36.0 g/dL   RDW 16.0 (H) 11.5 - 15.5 %   Platelets 260 150 - 400 K/uL   nRBC 0.0 0.0 - 0.2 %  Phosphorus  Result Value Ref Range   Phosphorus 2.9 2.5 - 4.6 mg/dL  Magnesium  Result Value Ref Range   Magnesium 2.0 1.7 - 2.4 mg/dL  Basic metabolic panel  Result Value Ref Range   Sodium 129 (L) 135 - 145 mmol/L   Potassium 3.7 3.5 - 5.1 mmol/L   Chloride 102 98 - 111 mmol/L   CO2 23 22 - 32 mmol/L   Glucose, Bld 92 70 - 99 mg/dL   BUN 13 8 - 23 mg/dL   Creatinine, Ser 0.61 0.44 - 1.00 mg/dL   Calcium 8.1 (L) 8.9 - 10.3 mg/dL   GFR, Estimated >60 >60 mL/min   Anion gap 4 (L) 5 - 15  Osmolality  Result Value Ref Range   Osmolality 282 275 - 295 mOsm/kg   I personally reviewed labs  Imaging: CT head:Hematoma in the right temporal operculum measuring  up to 3 cm, unchanged. Mild adjacent edema. Regional subarachnoid hemorrhage is  unchanged. On coronal reformats there is question of a trace subdural collection which is not high density, 2 mm in thickness.   Impression/Plan:  Ms. Kosakowski is here for evaluation of atraumatic hemorrhage within the brain and CTA of the head does not show any aneurysm.  Given the stability seen on the most recent CT and the fact that she is at her neurologic baseline, no further imaging is needed at this time.  I do not think she will need any surgical intervention but I do encourage documentation of if the patient desires this with palliative care.  Plan: Recommend continue Keppra 750 mg twice daily for seizure prophylaxis for 1 week Hold all antiplatelet therapy for at least 1 week. Can start DVT prophylaxis 72 hours from last CT scan No neurosurgical intervention needed, can follow-up as an outpatient   Deetta Perla, MD

## 2022-11-19 NOTE — ED Notes (Addendum)
Note removed entered in error

## 2022-11-19 NOTE — Assessment & Plan Note (Signed)
Urinalysis    Component Value Date/Time   COLORURINE AMBER (A) 11/18/2022 1541   APPEARANCEUR CLOUDY (A) 11/18/2022 1541   LABSPEC 1.020 11/18/2022 1541   PHURINE 5.0 11/18/2022 1541   GLUCOSEU NEGATIVE 11/18/2022 1541   HGBUR SMALL (A) 11/18/2022 1541   BILIRUBINUR NEGATIVE 11/18/2022 1541   KETONESUR 20 (A) 11/18/2022 1541   PROTEINUR NEGATIVE 11/18/2022 1541   NITRITE POSITIVE (A) 11/18/2022 1541   LEUKOCYTESUR MODERATE (A) 11/18/2022 1541  We will cont pt on rocephin follow C/S.

## 2022-11-19 NOTE — Progress Notes (Signed)
[facility] Progress Note  Patient: Mandy Reyes    [id number]  DOA: 11/18/2022     Date of Service: the patient was seen and examined on 11/19/2022  Chief Complaint  Patient presents with   Fall   Brief hospital course: Mandy Reyes is an 82 y.o. female  with PMH of Alzheimer's dementia, hypothyroid, depression, presented at [facility] ED on 11/18/2022 with fall.  As per ED HPI patient had witnessed fall at her care home where she tripped on the stairs, striking her head without LOC. Pt. developed right scalp hematoma.   ED workup: CT Head showed Small volume subdural and subarachnoid hemorrhage about the right frontal pole and Sylvian fissure. No overlying skull fracture. Soft tissue contusion and laceration of the left scalp vertex. CTA: No intracranial arterial occlusion or hemodynamically significant stenosis. Unchanged appearance of right frontal operculum intraparenchymal hematoma and small volume right convexity subarachnoid blood  Neurosurgery was consulted, recommended to admit on observation and further management as below.  Assessment and Plan:  Intracranial hemorrhage Intraparenchymal, subdural and subarachnoid hemorrhage right frontal pole CT head as above Continue Keppra 750 mg IV twice daily, transition to oral when patient is able to swallow Keep SBP less than 140 CT head repeated on 3/17, no changes from prior CT scan Continue fall precautions, aspiration precautions Follow PT and OT eval Follow neurology for further recommendation Palliative care consulted for goals of care discussion   UTI, UA positive Continue ceftriaxone 1 g IV daily Follow urine culture  Isotonic hyponatremia, most likely nutritional deficiency serum osmolarity 282 WNL Monitor sodium level daily Continue IV fluid for hydration   Hypothyroid on Synthroid Depression, continued risperidone and Zoloft Alzheimer's dementia, continue Aricept   There is no height or weight on  file to calculate BMI.  Interventions:        Diet: N.p.o., SLP eval is pending DVT Prophylaxis: SCD, pharmacological prophylaxis contraindicated due to intracranial bleeding    Advance goals of care discussion: DNR  Family Communication: family was not present at bedside, at the time of interview.  Patient has Alzheimer's dementia, unable to comprehend and communicate. Palliative care consulted to discuss goals of care with family.  Patient is DNR.   Disposition:  Pt is from [place], admitted with fall and intracranial hemorrhage, still has risk of fall, n.p.o. on IV fluids, which precludes a safe discharge. Discharge to SNF versus hospice, TBD after PT/OT and palliative care evaluation. May need few days to improve TOC consulted for placement   Subjective: No significant events overnight, patient was admitted last night due to fall and intracranial hemorrhage, patient has significant dementia, resting comfortably, unable to offer any complaints, patient is nonverbal at baseline.  Physical Exam: General: NAD, lying comfortably Appear in no distress, affect demented Eyes: PERRLA ENT: Oral Mucosa Clear, moist Neck: no JVD, right temporal hematoma/bruise Cardiovascular: S1 and S2 Present, no Murmur,  Respiratory: good respiratory effort, Bilateral Air entry equal and Decreased, no Crackles, no wheezes Abdomen: Bowel Sound present, Soft and no tenderness,  Skin: no rashes Extremities: no Pedal edema, no calf tenderness Neurologic: without any new focal findings Gait not checked due to patient safety concerns  Vitals:   11/19/22 0600 11/19/22 0630 11/19/22 0742 11/19/22 0930  BP: (!) 101/57 106/60  127/65  Pulse: (!) 56 (!) 58  (!) 58  Resp: 15 14  17   Temp:   98.1 F (36.7 C)   TempSrc:   Axillary   SpO2: 94% 94%  98%    Intake/Output Summary (Last 24 hours) at 11/19/2022 1052 Last data filed at 11/19/2022 1049 Gross per 24 hour  Intake 300 ml  Output --  Net 300  ml   There were no vitals filed for this visit.  Data Reviewed: I have personally reviewed and interpreted daily labs, tele strips, imagings as discussed above. I reviewed all nursing notes, pharmacy notes, vitals, pertinent old records I have discussed plan of care as described above with RN and patient/family.  CBC: Recent Labs  Lab 11/18/22 1512 11/19/22 0526  WBC 13.4* 10.1  NEUTROABS 11.7*  --   HGB 11.4* 11.5*  HCT 34.5* 35.1*  MCV 88.2 88.9  PLT 249 [id number]   Basic Metabolic Panel: Recent Labs  Lab 11/18/22 1512 11/19/22 0910  NA 130* 129*  K 3.5 3.7  CL 101 102  CO2 22 23  GLUCOSE 88 92  BUN 16 13  CREATININE 0.59 0.61  CALCIUM 8.4* 8.1*  MG  --  2.0  PHOS  --  2.9    Studies: CT HEAD WO CONTRAST  Result Date: 11/19/2022 CLINICAL DATA:  Fall with intracranial hemorrhage EXAM: CT HEAD WITHOUT CONTRAST TECHNIQUE: Contiguous axial images were obtained from the base of the skull through the vertex without intravenous contrast. RADIATION DOSE REDUCTION: This exam was performed according to the departmental dose-optimization program which includes automated exposure control, adjustment of the mA and/or kV according to patient size and/or use of iterative reconstruction technique. COMPARISON:  Yesterday FINDINGS: Brain: Hematoma in the right temporal operculum measuring up to 3 cm, unchanged. Mild adjacent edema. Regional subarachnoid hemorrhage is unchanged. On coronal reformats there is question of a trace subdural collection which is not high density, 2 mm in thickness. Cerebral volume loss especially at the temporal lobes. No evidence of infarct or mass. Vascular: No hyperdense vessel or unexpected calcification. Skull: Normal. Negative for fracture or focal lesion. Sinuses/Orbits: No acute finding. IMPRESSION: Unchanged intracranial hemorrhage on the right. No worrisome mass effect. No new abnormality. Electronically Signed   By: Jorje Guild M.D.   On: 11/19/2022 09:08    CT ANGIO HEAD W OR WO CONTRAST  Result Date: 11/18/2022 CLINICAL DATA:  Subarachnoid hemorrhage EXAM: CT ANGIOGRAPHY HEAD TECHNIQUE: Multidetector CT imaging of the head was performed using the standard protocol during bolus administration of intravenous contrast. Multiplanar CT image reconstructions and MIPs were obtained to evaluate the vascular anatomy. RADIATION DOSE REDUCTION: This exam was performed according to the departmental dose-optimization program which includes automated exposure control, adjustment of the mA and/or kV according to patient size and/or use of iterative reconstruction technique. CONTRAST:  37mL OMNIPAQUE IOHEXOL 350 MG/ML SOLN COMPARISON:  None Available. FINDINGS: POSTERIOR CIRCULATION: --Vertebral arteries: Normal --Inferior cerebellar arteries: Normal. --Basilar artery: Diminutive basilar artery in the context of bilateral fetal PCAs. --Superior cerebellar arteries: Normal. --Posterior cerebral arteries: Normal. Both are predominantly supplied by the posterior communicating arteries (p-comm). ANTERIOR CIRCULATION: --Intracranial internal carotid arteries: Atherosclerotic calcification of the internal carotid arteries at the skull base without hemodynamically significant stenosis. --Anterior cerebral arteries (ACA): Normal. --Middle cerebral arteries (MCA): Normal. Unchanged appearance of right frontal operculum intraparenchymal hematoma head and small volume right convexity subarachnoid blood. Review of the MIP images confirms the above findings. IMPRESSION: 1. No intracranial arterial occlusion or hemodynamically significant stenosis. 2. Unchanged appearance of right frontal operculum intraparenchymal hematoma and small volume right convexity subarachnoid blood. Electronically Signed   By: Ulyses Jarred M.D.   On: 11/18/2022 23:29   CT  Head Wo Contrast  Result Date: 11/18/2022 CLINICAL DATA:  Follow-up intracranial hemorrhage EXAM: CT HEAD WITHOUT CONTRAST TECHNIQUE:  Contiguous axial images were obtained from the base of the skull through the vertex without intravenous contrast. RADIATION DOSE REDUCTION: This exam was performed according to the departmental dose-optimization program which includes automated exposure control, adjustment of the mA and/or kV according to patient size and/or use of iterative reconstruction technique. COMPARISON:  CT brain 11/18/2022, 01/15/2019 FINDINGS: Brain: Interval development of acute intraparenchymal hemorrhage within the right frontal operculum measuring 3.2 x 2.4 by 2.1 cm, estimated volume of 8.1 cc. Moderate surrounding edema. No measurable midline shift at this time. Slight interval increase in small volume subarachnoid blood over the right frontal and temporal lobes. Possible small focus of cortical contusion right temporal lobe, series 2, image 10. Small volume acute hemorrhage in the right sylvian fissure as before. Stable ventricle size. Atrophy and chronic small vessel ischemic changes of the white matter. Vascular: No hyperdense vessels.  Carotid vascular calcification Skull: Normal. Negative for fracture or focal lesion. Sinuses/Orbits: No acute finding. Mucosal thickening in the sinuses. Other: Right supraorbital scalp swelling. Left posterior scalp laceration and hematoma. IMPRESSION: 1. Interval development of acute intraparenchymal hemorrhage within the right frontal operculum measuring up to 3.2 cm. Moderate surrounding edema but no measurable midline shift at this time. Slight interval increase in small volume subarachnoid blood over the right frontal and temporal lobes. Possible small focus of cortical contusion right temporal lobe. 2. Atrophy and chronic small vessel ischemic changes of the white matter. Critical Value/emergent results were called by telephone at the time of interpretation on 11/18/2022 at 9:44 pm to provider Roderic Palau in ED , who verbally acknowledged these results. Electronically Signed   By: Donavan Foil  M.D.   On: 11/18/2022 21:44   CT Cervical Spine Wo Contrast  Result Date: 11/18/2022 CLINICAL DATA:  Fall with neck pain. EXAM: CT CERVICAL SPINE WITHOUT CONTRAST TECHNIQUE: Multidetector CT imaging of the cervical spine was performed without intravenous contrast. Multiplanar CT image reconstructions were also generated. RADIATION DOSE REDUCTION: This exam was performed according to the departmental dose-optimization program which includes automated exposure control, adjustment of the mA and/or kV according to patient size and/or use of iterative reconstruction technique. COMPARISON:  Cervical spine radiographs dated 10/28/2021. FINDINGS: Alignment: Gentle kyphosis at C4-5 is likely positional or degenerative. No traumatic subluxation. Skull base and vertebrae: No acute fracture. No primary bone lesion or focal pathologic process. Soft tissues and spinal canal: No prevertebral fluid or swelling. No visible canal hematoma. Disc levels:  Multilevel degenerative disc and joint disease. Upper chest: Negative. Other: None. IMPRESSION: 1. No acute osseous injury of the cervical spine. Electronically Signed   By: Zerita Boers M.D.   On: 11/18/2022 16:23   DG Chest Portable 1 View  Result Date: 11/18/2022 CLINICAL DATA:  Fall EXAM: PORTABLE CHEST 1 VIEW COMPARISON:  01/15/2019 FINDINGS: No acute airspace disease or pleural effusion. Normal cardiomediastinal silhouette with aortic atherosclerosis. Negative for pneumothorax. IMPRESSION: No active disease. Electronically Signed   By: Donavan Foil M.D.   On: 11/18/2022 15:53   CT Head Wo Contrast  Result Date: 11/18/2022 CLINICAL DATA:  Fall EXAM: CT HEAD WITHOUT CONTRAST TECHNIQUE: Multidetector CT imaging of the head spine was performed following the standard protocol without intravenous contrast. RADIATION DOSE REDUCTION: This exam was performed according to the departmental dose-optimization program which includes automated exposure control, adjustment of the  mA and/or kV according to patient size and/or use  of iterative reconstruction technique. COMPARISON:  01/15/2019 FINDINGS: CT HEAD FINDINGS Brain: Small volume subdural and subarachnoid hemorrhage about the right frontal pole and sylvian fissure (series 2, image 8). No evidence of acute infarction, hydrocephalus, extra-axial collection or mass lesion/mass effect. Periventricular and deep white matter hypodensity. Vascular: No hyperdense vessel or unexpected calcification. Skull: Normal. Negative for fracture or focal lesion. Sinuses/Orbits: No acute finding. Other: Soft tissue contusion and laceration of the left scalp vertex (series 2, image 18). IMPRESSION: 1. Small volume subdural and subarachnoid hemorrhage about the right frontal pole and Sylvian fissure. No overlying skull fracture. 2. Soft tissue contusion and laceration of the left scalp vertex. These results were called by telephone at the time of interpretation on 11/18/2022 at 3:09 pm to Dr Blake Divine , who verbally acknowledged these results. Electronically Signed   By: Delanna Ahmadi M.D.   On: 11/18/2022 15:11    Scheduled Meds:   stroke: early stages of recovery book   Does not apply Once   cyanocobalamin  1,000 mcg Oral Daily   donepezil  10 mg Oral Q breakfast   levothyroxine  50 mcg Oral Q0600   risperiDONE  0.5 mg Oral BID   sertraline  75 mg Oral Daily   sodium chloride flush  3 mL Intravenous Q12H   Continuous Infusions:  sodium chloride 50 mL/hr at 11/19/22 0430   cefTRIAXone (ROCEPHIN)  IV Stopped (11/19/22 1018)   levETIRAcetam Stopped (11/19/22 1049)   PRN Meds: acetaminophen **OR** acetaminophen, HYDROcodone-acetaminophen, morphine injection  Time spent: 35 minutes  Author: Val Riles. MD [facility] 11/19/2022 10:52 AM  To reach On-call, see care teams to locate the attending and reach out to them via [email or web address]. If 7PM-7AM, please contact night-coverage If you still have difficulty reaching the  attending provider, please page the [facility] (Director on Call) for [facility] on amion for assistance.

## 2022-11-19 NOTE — ED Notes (Signed)
Pt is alert with eyes open. Pt does not follow any commands. Pt withdrawls to pain. Pt does make some unorganized mumbled speech.

## 2022-11-19 NOTE — Evaluation (Signed)
Occupational Therapy Evaluation Patient Details Name: Mandy Reyes MRN: [id number] DOB: 1941/05/13 Today's Date: 11/19/2022   History of Present Illness Mandy Reyes is an 82 y.o. female  seen today after fall . Pt has dementia but is ambulatory at baseline and was wandering outside and fall and hit her head with laceration to back of her head and hematoma on right side of forehead. Fall was down 2 steps.   Clinical Impression   Patient seen for OT/PT co-treatment to maximize safety and participation. Pt with history of cognitive deficits. Per chart review, pt from LTC and was ambulatory at baseline. Pt found lying sideways on stretcher and required Total A for bed mobility in order to properly be repositioned. Pt unable to state name or follow commands this date. She kept her eyes closed t/o session except at the very end, however, still not acknowleding therapists in the room. Will keep on caseload for therapy trial to see if pt improves. OT will continue to follow acutely.    Recommendations for follow up therapy are one component of a multi-disciplinary discharge planning process, led by the attending physician.  Recommendations may be updated based on patient status, additional functional criteria and insurance authorization.   Follow Up Recommendations  Long-term institutional care without follow-up therapy     Assistance Recommended at Discharge Frequent or constant Supervision/Assistance  Patient can return home with the following A lot of help with bathing/dressing/bathroom;Direct supervision/assist for medications management;Direct supervision/assist for financial management;A lot of help with walking and/or transfers;Assistance with cooking/housework;Assist for transportation;Help with stairs or ramp for entrance;Assistance with feeding    Functional Status Assessment  Patient has had a recent decline in their functional status and demonstrates the ability to make significant  improvements in function in a reasonable and predictable amount of time.  Equipment Recommendations  None recommended by OT    Recommendations for Other Services       Precautions / Restrictions Precautions Precautions: Fall Restrictions Weight Bearing Restrictions: No      Mobility Bed Mobility Overal bed mobility: Needs Assistance Bed Mobility: Rolling Rolling: Total assist         General bed mobility comments: Pt found curled up in bed sideways, feet sticking through bed rails. Total A required for repositioning pt in bed.    Transfers                   General transfer comment: unable/unsafe to attempt at this time      Balance           ADL either performed or assessed with clinical judgement   ADL Overall ADL's : Needs assistance/impaired           General ADL Comments: Pt currently requring Total A for rolling in bed and all self-care tasks.     Vision Patient Visual Report: No change from baseline       Perception     Praxis      Pertinent Vitals/Pain Pain Assessment Pain Assessment: PAINAD Breathing: normal Negative Vocalization: none Facial Expression: smiling or inexpressive Body Language: relaxed Consolability: no need to console PAINAD Score: 0     Hand Dominance     Extremity/Trunk Assessment Upper Extremity Assessment Upper Extremity Assessment: Difficult to assess due to impaired cognition   Lower Extremity Assessment Lower Extremity Assessment: Difficult to assess due to impaired cognition       Communication Communication Communication: Other (comment) (not verbalizing at this time)   Cognition Arousal/Alertness:  Lethargic Behavior During Therapy: Flat affect Overall Cognitive Status: History of cognitive impairments - at baseline       General Comments: H/o of Alzheimers dementia at baseline. Unable to state name or follow commands this date. Kept eyes closed t/o session except at the very end, however,  still not acknowleding therapists in the room.     General Comments       Exercises     Shoulder Instructions      Home Living Family/patient expects to be discharged to:: Other (Comment) (Long term care facility)              Prior Functioning/Environment Prior Level of Function : Patient poor historian/Family not available             Mobility Comments: Per chart review, pt was ambulatory prior to admission ADLs Comments: pt unable to provide info - anticipate assistance required at baseline from facility but could use clarification        OT Problem List: Decreased strength;Decreased activity tolerance;Impaired balance (sitting and/or standing);Decreased cognition;Decreased safety awareness      OT Treatment/Interventions: Self-care/ADL training;Therapeutic exercise;Neuromuscular education;Energy conservation;DME and/or AE instruction;Manual therapy;Modalities;Balance training;Patient/family education;Visual/perceptual remediation/compensation;Cognitive remediation/compensation;Therapeutic activities;Splinting    OT Goals(Current goals can be found in the care plan section) Acute Rehab OT Goals Patient Stated Goal: none stated OT Goal Formulation: Patient unable to participate in goal setting Time For Goal Achievement: 12/03/22 Potential to Achieve Goals: Fair   OT Frequency: Min 1X/week    Co-evaluation PT/OT/SLP Co-Evaluation/Treatment: Yes Reason for Co-Treatment: To address functional/ADL transfers;Necessary to address cognition/behavior during functional activity;For patient/therapist safety PT goals addressed during session: Mobility/safety with mobility OT goals addressed during session: ADL's and self-care      AM-PAC OT "6 Clicks" Daily Activity     Outcome Measure Help from another person eating meals?: Total Help from another person taking care of personal grooming?: Total Help from another person toileting, which includes using toliet, bedpan, or  urinal?: Total Help from another person bathing (including washing, rinsing, drying)?: Total Help from another person to put on and taking off regular upper body clothing?: Total Help from another person to put on and taking off regular lower body clothing?: Total 6 Click Score: 6   End of Session Nurse Communication: Mobility status  Activity Tolerance: Patient limited by lethargy;Other (comment) (cognition) Patient left: in bed;with call bell/phone within reach;with bed alarm set  OT Visit Diagnosis: Other abnormalities of gait and mobility (R26.89);Muscle weakness (generalized) (M62.81);Other symptoms and signs involving cognitive function;History of falling (Z91.81)                Time: [id number] OT Time Calculation (min): 8 min Charges:  OT General Charges $OT Visit: 1 Visit OT Evaluation $OT Eval Low Complexity: 1 Low  [facility] MS, OTR/L ascom [phone]  11/19/22, 2:43 PM

## 2022-11-20 DIAGNOSIS — S06340A Traumatic hemorrhage of right cerebrum without loss of consciousness, initial encounter: Secondary | ICD-10-CM | POA: Diagnosis not present

## 2022-11-20 LAB — CBC
HCT: 35.1 % — ABNORMAL LOW (ref 36.0–46.0)
Hemoglobin: 11.2 g/dL — ABNORMAL LOW (ref 12.0–15.0)
MCH: 28.6 pg (ref 26.0–34.0)
MCHC: 31.9 g/dL (ref 30.0–36.0)
MCV: 89.5 fL (ref 80.0–100.0)
Platelets: 261 10*3/uL (ref 150–400)
RBC: 3.92 MIL/uL (ref 3.87–5.11)
RDW: 15.9 % — ABNORMAL HIGH (ref 11.5–15.5)
WBC: 7 10*3/uL (ref 4.0–10.5)
nRBC: 0 % (ref 0.0–0.2)

## 2022-11-20 LAB — BASIC METABOLIC PANEL
Anion gap: 11 (ref 5–15)
BUN: 10 mg/dL (ref 8–23)
CO2: 18 mmol/L — ABNORMAL LOW (ref 22–32)
Calcium: 8.3 mg/dL — ABNORMAL LOW (ref 8.9–10.3)
Chloride: 102 mmol/L (ref 98–111)
Creatinine, Ser: 0.55 mg/dL (ref 0.44–1.00)
GFR, Estimated: >60 mL/min (ref >60)
Glucose, Bld: 80 mg/dL (ref 70–99)
Potassium: 3.4 mmol/L — ABNORMAL LOW (ref 3.5–5.1)
Sodium: 131 mmol/L — ABNORMAL LOW (ref 135–145)

## 2022-11-20 LAB — MAGNESIUM: Magnesium: 1.9 mg/dL (ref 1.7–2.4)

## 2022-11-20 LAB — PHOSPHORUS: Phosphorus: 2.5 mg/dL (ref 2.5–4.6)

## 2022-11-20 MED ORDER — SODIUM CHLORIDE 0.9 % IV SOLN: INTRAVENOUS | Status: DC

## 2022-11-20 MED ORDER — LACTATED RINGERS IV SOLN
INTRAVENOUS | Status: DC
  Administered 2022-11-21: 1000 mL via INTRAVENOUS

## 2022-11-20 MED ORDER — CYANOCOBALAMIN 1000 MCG/ML IJ SOLN
1000.0000 ug | Freq: Every day | INTRAMUSCULAR | Status: DC
  Administered 2022-11-20 – 2022-11-22 (×3): 1000 ug via INTRAMUSCULAR
  Filled 2022-11-20 (×3): qty 1

## 2022-11-20 MED ORDER — VITAMIN B-12 1000 MCG PO TABS: 1000.0000 ug | ORAL_TABLET | Freq: Every day | ORAL | Status: DC

## 2022-11-20 NOTE — Progress Notes (Signed)
Initial Nutrition Assessment  DOCUMENTATION CODES:   Severe malnutrition in context of chronic illness  INTERVENTION:   -RD will follow for diet advancement and goals of care discussions and make further recommendations as appropriate  NUTRITION DIAGNOSIS:   Severe Malnutrition related to chronic illness (dementia) as evidenced by mild fat depletion, moderate fat depletion, moderate muscle depletion, severe muscle depletion, percent weight loss.  GOAL:   Patient will meet greater than or equal to 90% of their needs  MONITOR:   Diet advancement  REASON FOR ASSESSMENT:   Malnutrition Screening Tool    ASSESSMENT:   Pt with PMH of dementia is admitted with UTI and intracerebral hemorrhage.  Pt admitted with UTI and intracerebral hemorrhage.   3/17- inappropriate for SLP evaluation 3/18- unable to participate in SLP evaluation  Reviewed I/O's: +932 ml x 24 hours and +1 L since admission  Pt lying in bed at time of visit. Her eyes were open, but she did not respond to voice or touch. No family at bedside to provide additional history.    Reviewed wt hx; pt has experienced a 32.3% wt loss over the past 13 months, which is significant for time frame.   Palliative care has been consulted for goals of care discussions. Per notes, pt may qualify for hospice. If pt unable to safely swallow and consume enough nutrition to meet needs, would not recommend alternative means of nutrition/ hydration due to advanced age and dementia, as this would not enhance pt's quality of life.   Medications reviewed and include vitamin B-12, keppra, and 0.9% sodium chloride infusion @ 75 ml/hr.   Labs reviewed: CBGS: 131, K: 3.4 (inpatient orders for glycemic control are none).    NUTRITION - FOCUSED PHYSICAL EXAM:  Flowsheet Row Most Recent Value  Orbital Region Moderate depletion  Upper Arm Region Moderate depletion  Thoracic and Lumbar Region Mild depletion  Buccal Region Moderate depletion   Temple Region Moderate depletion  Clavicle Bone Region Severe depletion  Clavicle and Acromion Bone Region Moderate depletion  Scapular Bone Region Moderate depletion  Dorsal Hand Severe depletion  Patellar Region Moderate depletion  Anterior Thigh Region Moderate depletion  Posterior Calf Region Moderate depletion  Edema (RD Assessment) None  Hair Reviewed  Eyes Reviewed  Mouth Reviewed  Skin Reviewed  Nails Reviewed       Diet Order:   Diet Order             Diet NPO time specified  Diet effective now                   EDUCATION NEEDS:   Not appropriate for education at this time  Skin:  Skin Assessment: Skin Integrity Issues: Skin Integrity Issues:: Other (Comment) Other: skin tears to lt hand and lt arm  Last BM:  11/20/22 (type 5)  Height:   Ht Readings from Last 1 Encounters:  11/19/22 5\' 2"  (1.575 m)    Weight:   Wt Readings from Last 1 Encounters:  11/19/22 46 kg    Ideal Body Weight:  50 kg  BMI:  Body mass index is 18.56 kg/m.  Estimated Nutritional Needs:   Kcal:  1400-1600  Protein:  70-85 grams  Fluid:  > 1.4 L    Loistine Chance, RD, LDN, [place] Registered Dietitian II Certified Diabetes Care and Education Specialist Please refer to [facility] for RD and/or RD on-call/weekend/after hours pager

## 2022-11-20 NOTE — Progress Notes (Signed)
Physical Therapy Treatment Patient Details Name: Mandy Reyes MRN: [id number] DOB: 11/25/1940 Today's Date: 11/20/2022   History of Present Illness Mandy Reyes is an 82 y.o. female  seen today after fall . Pt has dementia but is ambulatory at baseline and was wandering outside and fall and hit her head with laceration to back of her head and hematoma on right side of forehead. Fall was down 2 steps.    PT Comments    Pt sleeping upon arrival, however awakens to touch. Pt remains nonverbal throughout session, however demonstrates physical movement with ability to assist in initiating movement to EOB. Heavy assist for balance once seated, although improved with time. Pt then able to stand x multiple reps for bed linen change. Unable to participate in there-ex this date. Will continue with PT trials to attempt progress towards goals.   Recommendations for follow up therapy are one component of a multi-disciplinary discharge planning process, led by the attending physician.  Recommendations may be updated based on patient status, additional functional criteria and insurance authorization.  Follow Up Recommendations  Long-term institutional care without follow-up therapy Can patient physically be transported by private vehicle: No   Assistance Recommended at Discharge Frequent or constant Supervision/Assistance  Patient can return home with the following A lot of help with walking and/or transfers;A lot of help with bathing/dressing/bathroom;Assist for transportation;Direct supervision/assist for medications management   Equipment Recommendations  None recommended by PT    Recommendations for Other Services       Precautions / Restrictions Precautions Precautions: Fall Restrictions Weight Bearing Restrictions: No     Mobility  Bed Mobility Overal bed mobility: Needs Assistance Bed Mobility: Supine to Sit     Supine to sit: Mod assist     General bed mobility comments:  doesn't follow commands but when initiated mobility, does participate and is able to sit at EOB with varying levels of assist ranging from total assist to cga after extended time. +2 for return to supine and repositioning.    Transfers Overall transfer level: Needs assistance Equipment used: 1 person hand held assist Transfers: Sit to/from Stand Sit to Stand: Max assist           General transfer comment: unable to follow commands however while sitting at EOB, is able to initiate standing and able to perform full stand with B HHA. Unable to fully achieve upright posture and limited buttock clearance. Able to perform multiple reps, still requires max assist    Ambulation/Gait               General Gait Details: not attempted   Stairs             Wheelchair Mobility    Modified Rankin (Stroke Patients Only)       Balance Overall balance assessment: Needs assistance Sitting-balance support: Feet supported, Bilateral upper extremity supported Sitting balance-Leahy Scale: Poor     Standing balance support: Bilateral upper extremity supported Standing balance-Leahy Scale: Zero                              Cognition Arousal/Alertness: Awake/alert Behavior During Therapy: Flat affect Overall Cognitive Status: History of cognitive impairments - at baseline                                 General Comments: h/o of dementia. doesn't follow commands, however  is calm and not agitated        Exercises Other Exercises Other Exercises: able to roll to B sides for mesh panties adjustment with max assist    General Comments        Pertinent Vitals/Pain Pain Assessment Pain Assessment: PAINAD Breathing: normal Negative Vocalization: none Facial Expression: smiling or inexpressive Body Language: relaxed Consolability: no need to console PAINAD Score: 0    Home Living                          Prior Function             PT Goals (current goals can now be found in the care plan section) Acute Rehab PT Goals Patient Stated Goal: unable to state PT Goal Formulation: Patient unable to participate in goal setting Time For Goal Achievement: 12/03/22 Potential to Achieve Goals: Fair Progress towards PT goals: Progressing toward goals    Frequency    Min 2X/week (PT trial)      PT Plan Current plan remains appropriate    Co-evaluation              AM-PAC PT "6 Clicks" Mobility   Outcome Measure  Help needed turning from your back to your side while in a flat bed without using bedrails?: A Lot Help needed moving from lying on your back to sitting on the side of a flat bed without using bedrails?: A Lot Help needed moving to and from a bed to a chair (including a wheelchair)?: Total Help needed standing up from a chair using your arms (e.g., wheelchair or bedside chair)?: A Lot Help needed to walk in hospital room?: Total Help needed climbing 3-5 steps with a railing? : Total 6 Click Score: 9    End of Session   Activity Tolerance:  (limited by cognition) Patient left: in bed;with bed alarm set Nurse Communication: Mobility status PT Visit Diagnosis: Muscle weakness (generalized) (M62.81);Difficulty in walking, not elsewhere classified (R26.2);Other abnormalities of gait and mobility (R26.89);History of falling (Z91.81)     Time: [id number] PT Time Calculation (min) (ACUTE ONLY): 18 min  Charges:  $Therapeutic Activity: 8-22 mins                     Greggory Stallion, PT, DPT, GCS [phone]    Tonya Carlile 11/20/2022, 3:25 PM

## 2022-11-20 NOTE — Progress Notes (Signed)
    Attending Progress Note  History: Mandy Reyes is an 82 y.o presenting after a fall resulting hematoma with IPH   HD 2: pt unresponsive this morning. Has improved and appears to be at her baseline  Physical Exam: Vitals:   11/20/22 0539 11/20/22 0919  BP: 128/61 139/63  Pulse: 72 66  Resp: 19 16  Temp: 98.9 F (37.2 C) 98.6 F (37 C)  SpO2: 98% 98%    Alert but unresponsive to questions.  PERRL  Pt does not follow commands. Will attempt to stand unprompted with PT. Moves all extremities.  Data:  Other tests/results:  CT head FINDINGS: Brain: Hematoma in the right temporal operculum measuring up to 3 cm, unchanged. Mild adjacent edema. Regional subarachnoid hemorrhage is unchanged. On coronal reformats there is question of a trace subdural collection which is not high density, 2 mm in thickness.   Cerebral volume loss especially at the temporal lobes. No evidence of infarct or mass.   Vascular: No hyperdense vessel or unexpected calcification.   Skull: Normal. Negative for fracture or focal lesion.   Sinuses/Orbits: No acute finding.   IMPRESSION: Unchanged intracranial hemorrhage on the right. No worrisome mass effect. No new abnormality.     Electronically Signed   By: Jorje Guild M.D.   On: 11/19/2022 09:08  Assessment/Plan:  Mandy Reyes is an 82 y.o with a history of dementia presenting after a fall. CT head showing IPH.   - mobilize - consider neurology consult  - ok to start DVT ppx on 3/20 - Keppra 750mg  BID until 3/24 - PTOT  Cooper Render PA-C Department of Neurosurgery

## 2022-11-20 NOTE — Progress Notes (Signed)
[facility] Progress Note  Patient: Mandy Reyes    [id number]  DOA: 11/18/2022     Date of Service: the patient was seen and examined on 11/20/2022  Chief Complaint  Patient presents with   Fall   Brief hospital course: STEFANY MENDIOLA is an 82 y.o. female  with PMH of Alzheimer's dementia, hypothyroid, depression, presented at [facility] ED on 11/18/2022 with fall.  As per ED HPI patient had witnessed fall at her care home where she tripped on the stairs, striking her head without LOC. Pt. developed right scalp hematoma.   ED workup: CT Head showed Small volume subdural and subarachnoid hemorrhage about the right frontal pole and Sylvian fissure. No overlying skull fracture. Soft tissue contusion and laceration of the left scalp vertex. CTA: No intracranial arterial occlusion or hemodynamically significant stenosis. Unchanged appearance of right frontal operculum intraparenchymal hematoma and small volume right convexity subarachnoid blood  Neurosurgery was consulted, recommended to admit on observation and further management as below.  Assessment and Plan:  Intracranial hemorrhage Intraparenchymal, subdural and subarachnoid hemorrhage right frontal pole CT head as above Continue Keppra 750 mg IV twice daily, transition to oral when patient is able to swallow.  Continue Keppra for 1 week for seizure prophylaxis as per neurosurgery Keep SBP less than 140 CT head repeated on 3/17, no changes from prior CT scan Continue fall precautions, aspiration precautions PT and OT eval done, recommended long-term placement Neurosurgery is following, recommended no intervention but would like to know about goals of care.  Keppra 750 mg twice daily for seizure prophylaxis for 1 week, hold all antiplatelet therapy for at least 1 week, DVT prophylaxis can be started after 72 hours from the last CT scan. Palliative care consulted for goals of care discussion   UTI, UA positive Continue  ceftriaxone 1 g IV daily urine culture growing E. coli, follow sensitivity report  Isotonic hyponatremia, most likely nutritional deficiency serum osmolarity 282 WNL Monitor sodium level daily Continue IV fluid for hydration   Metabolic acidosis, bicarbonate 18 Started LR 75 mill per hour  Vitamin B12 deficiency, vitamin B12 level 176, goal >400, started vitamin B12 1000 mcg IM injection daily during hospital stay followed by oral supplement.  Hypothyroid on Synthroid Depression, continued risperidone and Zoloft Alzheimer's dementia, continue Aricept   There is no height or weight on file to calculate BMI.  Interventions:     Diet: N.p.o., SLP eval is pending DVT Prophylaxis: SCD, pharmacological prophylaxis contraindicated due to intracranial bleeding    Advance goals of care discussion: DNR  Family Communication: family was not present at bedside, at the time of interview.  Patient has Alzheimer's dementia, unable to comprehend and communicate. Palliative care consulted to discuss goals of care with family.  Patient is DNR.   Disposition:  Pt is from [place], admitted with fall and intracranial hemorrhage, still has risk of fall, n.p.o. on IV fluids, which precludes a safe discharge. Discharge to SNF versus hospice, TBD after palliative care evaluation. May need few days to improve TOC consulted for placement   Subjective: No significant events overnight, patient has significant dementia, unable to offer any complaints, seems to be resting comfortably.  Unable to respond to any commands.   Physical Exam: General: NAD, lying comfortably Appear in no distress, affect demented Eyes: PERRLA ENT: Oral Mucosa Clear, dry Neck: no JVD, right temporal hematoma/bruise Cardiovascular: S1 and S2 Present, no Murmur,  Respiratory: good respiratory effort, Bilateral Air entry equal and Decreased, no  Crackles, no wheezes Abdomen: Bowel Sound present, Soft and no tenderness,   Skin: no rashes Extremities: no Pedal edema, no calf tenderness Neurologic: without any new focal findings Gait not checked due to patient safety concerns  Vitals:   11/19/22 2105 11/20/22 0010 11/20/22 0539 11/20/22 0919  BP: (!) 128/56 139/64 128/61 139/63  Pulse: 68 70 72 66  Resp: (!) 8  19 16   Temp: 98.6 F (37 C) 98.8 F (37.1 C) 98.9 F (37.2 C) 98.6 F (37 C)  TempSrc: Oral Oral    SpO2: 98% 99% 98% 98%  Weight:      Height:        Intake/Output Summary (Last 24 hours) at 11/20/2022 1345 Last data filed at 11/20/2022 [id number] Gross per 24 hour  Intake 732.05 ml  Output --  Net 732.05 ml   Filed Weights   11/19/22 1500  Weight: 46 kg    Data Reviewed: I have personally reviewed and interpreted daily labs, tele strips, imagings as discussed above. I reviewed all nursing notes, pharmacy notes, vitals, pertinent old records I have discussed plan of care as described above with RN and patient/family.  CBC: Recent Labs  Lab 11/18/22 1512 11/19/22 0526 11/20/22 0550  WBC 13.4* 10.1 7.0  NEUTROABS 11.7*  --   --   HGB 11.4* 11.5* 11.2*  HCT 34.5* 35.1* 35.1*  MCV 88.2 88.9 89.5  PLT 249 260 0000000   Basic Metabolic Panel: Recent Labs  Lab 11/18/22 1512 11/19/22 0910 11/20/22 0550  NA 130* 129* 131*  K 3.5 3.7 3.4*  CL 101 102 102  CO2 22 23 18*  GLUCOSE 88 92 80  BUN 16 13 10   CREATININE 0.59 0.61 0.55  CALCIUM 8.4* 8.1* 8.3*  MG  --  2.0 1.9  PHOS  --  2.9 2.5    Studies: No results found.  Scheduled Meds:   stroke: early stages of recovery book   Does not apply Once   cyanocobalamin  1,000 mcg Intramuscular Daily   Followed by   Derrill Memo ON 11/27/2022] vitamin B-12  1,000 mcg Oral Daily   donepezil  10 mg Oral Q breakfast   levothyroxine  50 mcg Oral Q0600   risperiDONE  0.5 mg Oral BID   sertraline  75 mg Oral Daily   sodium chloride flush  3 mL Intravenous Q12H   Continuous Infusions:  sodium chloride 75 mL/hr at 11/20/22 0951    cefTRIAXone (ROCEPHIN)  IV 1 g (11/20/22 0955)   levETIRAcetam 750 mg (11/20/22 1029)   PRN Meds: acetaminophen **OR** acetaminophen, HYDROcodone-acetaminophen, morphine injection  Time spent: 35 minutes  Author: Val Riles. MD [facility] 11/20/2022 1:45 PM  To reach On-call, see care teams to locate the attending and reach out to them via [email or web address]. If 7PM-7AM, please contact night-coverage If you still have difficulty reaching the attending provider, please page the [facility] (Director on Call) for [facility] on amion for assistance.

## 2022-11-20 NOTE — Progress Notes (Addendum)
SLP Cancellation Note  Patient Details Name: Mandy Reyes MRN: [id number] DOB: 18-May-1941   Cancelled treatment:       Reason Eval/Treat Not Completed: Patient's level of consciousness. Consult received and appreciated. Chart reviewed. Pt laying sideways in bed upon ST arrival. ST turned lights on and attempted to rouse pt w/ voice and sternal rub. Pt remained unresponsive w/ eyes closed despite attempts. Will attempt BSE again when pt is awake and alert. RN to contact SLP should pt be able to participate meaningfully in evaluation.   Randall Hiss Graduate Clinician [place], Speech Pathology   Randall Hiss 11/20/2022, 8:47 AM

## 2022-11-21 DIAGNOSIS — S06340A Traumatic hemorrhage of right cerebrum without loss of consciousness, initial encounter: Secondary | ICD-10-CM | POA: Diagnosis not present

## 2022-11-21 DIAGNOSIS — Z7189 Other specified counseling: Secondary | ICD-10-CM | POA: Diagnosis not present

## 2022-11-21 LAB — URINE CULTURE: Culture: >=100000 — AB

## 2022-11-21 LAB — BASIC METABOLIC PANEL
Anion gap: 12 (ref 5–15)
BUN: 13 mg/dL (ref 8–23)
CO2: 18 mmol/L — ABNORMAL LOW (ref 22–32)
Calcium: 8.6 mg/dL — ABNORMAL LOW (ref 8.9–10.3)
Chloride: 101 mmol/L (ref 98–111)
Creatinine, Ser: 0.74 mg/dL (ref 0.44–1.00)
GFR, Estimated: >60 mL/min (ref >60)
Glucose, Bld: 87 mg/dL (ref 70–99)
Potassium: 3.4 mmol/L — ABNORMAL LOW (ref 3.5–5.1)
Sodium: 131 mmol/L — ABNORMAL LOW (ref 135–145)

## 2022-11-21 LAB — CBC
HCT: 32.5 % — ABNORMAL LOW (ref 36.0–46.0)
Hemoglobin: 11 g/dL — ABNORMAL LOW (ref 12.0–15.0)
MCH: 29.1 pg (ref 26.0–34.0)
MCHC: 33.8 g/dL (ref 30.0–36.0)
MCV: 86 fL (ref 80.0–100.0)
Platelets: 293 10*3/uL (ref 150–400)
RBC: 3.78 MIL/uL — ABNORMAL LOW (ref 3.87–5.11)
RDW: 15.6 % — ABNORMAL HIGH (ref 11.5–15.5)
WBC: 9.6 10*3/uL (ref 4.0–10.5)
nRBC: 0 % (ref 0.0–0.2)

## 2022-11-21 LAB — MAGNESIUM: Magnesium: 1.8 mg/dL (ref 1.7–2.4)

## 2022-11-21 LAB — PHOSPHORUS: Phosphorus: 2.1 mg/dL — ABNORMAL LOW (ref 2.5–4.6)

## 2022-11-21 MED ORDER — SODIUM CHLORIDE 0.9 % IV SOLN
750.0000 mg | Freq: Two times a day (BID) | INTRAVENOUS | Status: DC
  Administered 2022-11-21 – 2022-11-22 (×3): 750 mg via INTRAVENOUS
  Filled 2022-11-21 (×4): qty 7.5

## 2022-11-21 MED ORDER — LEVOTHYROXINE SODIUM 100 MCG/5ML IV SOLN: 35.0000 ug | Freq: Every day | INTRAVENOUS | Status: DC

## 2022-11-21 MED ORDER — ACETAMINOPHEN 10 MG/ML IV SOLN
1000.0000 mg | Freq: Once | INTRAVENOUS | Status: AC
  Administered 2022-11-21: 1000 mg via INTRAVENOUS
  Filled 2022-11-21: qty 100

## 2022-11-21 MED ORDER — SODIUM CHLORIDE 0.9 % IV SOLN
1.0000 g | Freq: Two times a day (BID) | INTRAVENOUS | Status: DC
  Administered 2022-11-21 – 2022-11-22 (×3): 1 g via INTRAVENOUS
  Filled 2022-11-21: qty 20
  Filled 2022-11-21: qty 1
  Filled 2022-11-21 (×2): qty 20

## 2022-11-21 MED ORDER — POTASSIUM PHOSPHATES 15 MMOLE/5ML IV SOLN
30.0000 mmol | Freq: Once | INTRAVENOUS | Status: AC
  Administered 2022-11-21: 30 mmol via INTRAVENOUS
  Filled 2022-11-21: qty 10

## 2022-11-21 NOTE — Evaluation (Cosign Needed Addendum)
Clinical/Bedside Swallow Evaluation Patient Details  Name: Mandy Reyes MRN: [id number] Date of Birth: [birth year]  Today's Date: 11/21/2022 Time: SLP Start Time (ACUTE ONLY): 1140 SLP Stop Time (ACUTE ONLY): 1210 SLP Time Calculation (min) (ACUTE ONLY): 30 min  Past Medical History:  Past Medical History:  Diagnosis Date   Alzheimer's dementia [facility])    Past Surgical History: History reviewed. No pertinent surgical history. HPI:  Per H&P, pt "is an 82 y.o. female  seen today after fall . Pt has dementia but is ambulatory at baseline and was wandering outside and fall and hit her head with laceration to back of her head and hematoma on right side of forehead. Fall was down 2 steps.  HPI is limited secondary to dementia patient has not per most name.  Chart review shows that podiatry visit states that patient was describing difficulty reaching her feet and painful nails so I am not sure about her being nonverbal at baseline, I suspect she is a poor historian or so because of the dementia."    Assessment / Plan / Recommendation  Clinical Impression   Pt seen today for BSE. Pt laying in bed w/ eyes closed upon ST arrival. Pt unresponsive to verbal rousing nor min tactile stim. Pt repositioned to sit upright in bed to participate and opened her eyes. Noted pt responded to visual stim w/ slow reaction time. Pt nonverbal t/o session and largely unresponsive to max verbal/visual/tactile stim. Noted pt felt warm to touch; RN updated and reported giving medication to address her fever. Pt left sitting up in bed w/ bed alarm set, call button in reach, and OT present.  Pt on RA; febrile; WBC WNL.  Oral care completed and notable for dry mouth/tongue, dried secretions, tongue coated white, and missing dentition. Pt attempted close mouth around oral swab x1 and required tactile cue of ST's hand on pt's chin to keep mouth open. Pt not responsive to oral stim from oral care swab. ST attempted oral stim w/ ice  chips, running ice chip over pt's lips and tongue to stimulate response and/or lingual/labial movement. In one instance, pt closed mouth in response to melted ice chips. Pt swallowed ~ 2 times w/ melted ice chips. Noted cough x1, indicating intact sensation in larynx and/or pharynx.   Pt is not safe for po's at this time d/t pt's cognitive status.  Recommend continue NPO diet at this time. Recommend NSG provides oral care frequently. ST will continue to monitor pt in next 1-2 days for potential diet upgrade if pt's mental status improves to be appropriate for po intake.   SLP Visit Diagnosis: Dysphagia, unspecified (R13.10);Cognitive communication deficit (R41.841)    Aspiration Risk  Severe aspiration risk;Risk for inadequate nutrition/hydration    Diet Recommendation   NPO  Medication Administration: Via alternative means    Other  Recommendations Oral Care Recommendations: Oral care QID;Staff/trained caregiver to provide oral care    Recommendations for follow up therapy are one component of a multi-disciplinary discharge planning process, led by the attending physician.  Recommendations may be updated based on patient status, additional functional criteria and insurance authorization.  Follow up Recommendations Skilled nursing-short term rehab (<3 hours/day)      Assistance Recommended at Discharge  Full  Functional Status Assessment Patient has had a recent decline in their functional status and/or demonstrates limited ability to make significant improvements in function in a reasonable and predictable amount of time  Frequency and Duration min 2x/week  2 weeks  Prognosis Prognosis for improved oropharyngeal function: Guarded Barriers to Reach Goals: Cognitive deficits;Severity of deficits;Behavior;Time post onset      Swallow Study   General Date of Onset: 11/18/22 HPI: Per H&P, pt "is an 82 y.o. female  seen today after fall . Pt has dementia but is ambulatory at  baseline and was wandering outside and fall and hit her head with laceration to back of her head and hematoma on right side of forehead. Fall was down 2 steps.  HPI is limited secondary to dementia patient has not per most name.  Chart review shows that podiatry visit states that patient was describing difficulty reaching her feet and painful nails so I am not sure about her being nonverbal at baseline, I suspect she is a poor historian or so because of the dementia." Type of Study: Bedside Swallow Evaluation Diet Prior to this Study: NPO Temperature Spikes Noted: Yes (WBC 9.6) Respiratory Status: Room air History of Recent Intubation: No Behavior/Cognition: Requires cueing;Doesn't follow directions;Lethargic/Drowsy Oral Cavity Assessment: Dry;Dried secretions Oral Care Completed by SLP: Yes Oral Cavity - Dentition: Missing dentition Vision:  (N/A) Self-Feeding Abilities: Total assist Patient Positioning: Upright in bed Baseline Vocal Quality: Not observed Volitional Cough: Cognitively unable to elicit Volitional Swallow: Able to elicit    Oral/Motor/Sensory Function Overall Oral Motor/Sensory Function:  (Unable to assess)   Ice Chips Ice chips: Not tested   Thin Liquid Thin Liquid: Not tested    Nectar Thick Nectar Thick Liquid: Not tested   Honey Thick Honey Thick Liquid: Not tested   Puree Puree: Not tested   Solid     Solid: Not tested     Randall Hiss Graduate Clinician [place], Speech Pathology   Randall Hiss 11/21/2022,2:16 PM

## 2022-11-21 NOTE — Progress Notes (Signed)
Occupational Therapy Treatment Patient Details Name: Mandy Reyes MRN: [id number] DOB: 17-Jul-1941 Today's Date: 11/21/2022   History of present illness Mandy Reyes is an 82 y.o. female  seen today after fall . Pt has dementia but is ambulatory at baseline and was wandering outside and fall and hit her head with laceration to back of her head and hematoma on right side of forehead. Fall was down 2 steps.   OT comments  Patient received in semi-fowlers position in bed with SLP present. Pt non-verbal t/o session, unable to complete head nods/eye blinks in response to questions. She required [facility] assistance for bed level grooming tasks and simulated self-feeding. OT completed gentle PROM to BUEs (see details below). Attempted "place and hold" of B arms. Pt able to briefly hold RUE in place and slowly to surface of bed. Pt was left with all needs in reach and BUE elevated on pillow. Will continue with OT trial to attempt progress toward goals.    Recommendations for follow up therapy are one component of a multi-disciplinary discharge planning process, led by the attending physician.  Recommendations may be updated based on patient status, additional functional criteria and insurance authorization.    Follow Up Recommendations  Long-term institutional care without follow-up therapy     Assistance Recommended at Discharge  Frequent or constant Supervision/Assistance   Patient can return home with the following  A lot of help with bathing/dressing/bathroom;Direct supervision/assist for medications management;Direct supervision/assist for financial management;A lot of help with walking and/or transfers;Assistance with cooking/housework;Assist for transportation;Help with stairs or ramp for entrance;Assistance with feeding   Equipment Recommendations  None recommended by OT    Recommendations for Other Services      Precautions / Restrictions Precautions Precautions:  Fall Restrictions Weight Bearing Restrictions: No       Mobility Bed Mobility                    Transfers       Balance       ADL either performed or assessed with clinical judgement   ADL Overall ADL's : Needs assistance/impaired   Eating/Feeding Details (indicate cue type and reason): HOH assistance required to bring B hands to mouth to simulate self-feeding Grooming: Total assistance;Bed level;Wash/dry face               Lower Body Dressing: Bed level;Total assistance                      Extremity/Trunk Assessment Upper Extremity Assessment Upper Extremity Assessment: Difficult to assess due to impaired cognition   Lower Extremity Assessment Lower Extremity Assessment: Difficult to assess due to impaired cognition        Vision Patient Visual Report: No change from baseline     Perception     Praxis      Cognition Arousal/Alertness: Lethargic Behavior During Therapy: Flat affect Overall Cognitive Status: History of cognitive impairments - at baseline         General Comments: Pt non-verbal t/o, not responsive to painful stimuli. Pt with blank stare & not acknowledging therapist when moving around room. Unable to squeeze therapist's fingers on commands or complete head nods/eye blinks in response to questions.        Exercises General Exercises - Upper Extremity Shoulder Flexion: PROM, Both, 5 reps, Supine (to 90 deg) Elbow Flexion: PROM, Both, 5 reps, Supine Elbow Extension: PROM, Both, 5 reps, Supine Wrist Flexion: PROM, Both, 5 reps, Supine Wrist  Extension: PROM, Both, 5 reps, Supine Digit Composite Flexion: PROM, Both, 5 reps, Supine Composite Extension: PROM, Both, 5 reps, Supine    Shoulder Instructions       General Comments      Pertinent Vitals/ Pain       Pain Assessment Pain Assessment: PAINAD Breathing: normal Negative Vocalization: none Facial Expression: smiling or inexpressive Body Language:  relaxed Consolability: no need to console PAINAD Score: 0  Home Living            Prior Functioning/Environment              Frequency  Min 1X/week        Progress Toward Goals  OT Goals(current goals can now be found in the care plan section)  Progress towards OT goals: OT to reassess next treatment  Acute Rehab OT Goals Patient Stated Goal: none stated OT Goal Formulation: Patient unable to participate in goal setting Time For Goal Achievement: 12/03/22 Potential to Achieve Goals: [place] Discharge plan remains appropriate;Frequency remains appropriate    Co-evaluation                 AM-PAC OT "6 Clicks" Daily Activity     Outcome Measure   Help from another person eating meals?: Total Help from another person taking care of personal grooming?: Total Help from another person toileting, which includes using toliet, bedpan, or urinal?: Total Help from another person bathing (including washing, rinsing, drying)?: Total Help from another person to put on and taking off regular upper body clothing?: Total Help from another person to put on and taking off regular lower body clothing?: Total 6 Click Score: 6    End of Session    OT Visit Diagnosis: Other abnormalities of gait and mobility (R26.89);Muscle weakness (generalized) (M62.81);Other symptoms and signs involving cognitive function;History of falling (Z91.81)   Activity Tolerance Patient limited by lethargy;Other (comment) (cognition)   Patient Left in bed;with call bell/phone within reach;with bed alarm set   Nurse Communication Mobility status        Time: 1145-1157 OT Time Calculation (min): 12 min  Charges: OT General Charges $OT Visit: 1 Visit OT Treatments $Self Care/Home Management : 8-22 mins  [facility] MS, OTR/L ascom [phone]  11/21/22, 4:04 PM

## 2022-11-21 NOTE — TOC Progression Note (Addendum)
Transition of Care [facility]) - Progression Note    Patient Details  Name: Mandy Reyes MRN: [id number] Date of Birth: [birth year]  [facility] CM/SW Contact  Ross Ludwig, [place] Phone Number: 11/21/2022, 5:30 PM  Clinical Narrative:    Patient from [facility] group [facility]. Palliative recommending hospice services if patient's family agreeable. Palliative team tried contacting patient's family, unable to get a hold of them.  Palliative to try again tomorrow, TOC to continue to follow patient's progress throughout discharge planning.       Expected Discharge Plan and Services                                               Social Determinants of Health (SDOH) Interventions SDOH Screenings   Tobacco Use: Medium Risk (11/18/2022)    Readmission Risk Interventions     No data to display

## 2022-11-21 NOTE — Progress Notes (Addendum)
[facility] Progress Note  Patient: Mandy Reyes    [id number]  DOA: 11/18/2022     Date of Service: the patient was seen and examined on 11/21/2022  Chief Complaint  Patient presents with   Fall   Brief hospital course: Mandy Reyes is an 82 y.o. female  with PMH of Alzheimer's dementia, hypothyroid, depression, presented at [facility] ED on 11/18/2022 with fall.  As per ED HPI patient had witnessed fall at her care home where she tripped on the stairs, striking her head without LOC. Pt. developed right scalp hematoma.   ED workup: CT Head showed Small volume subdural and subarachnoid hemorrhage about the right frontal pole and Sylvian fissure. No overlying skull fracture. Soft tissue contusion and laceration of the left scalp vertex. CTA: No intracranial arterial occlusion or hemodynamically significant stenosis. Unchanged appearance of right frontal operculum intraparenchymal hematoma and small volume right convexity subarachnoid blood  Neurosurgery was consulted, recommended to admit on observation and further management as below.  Assessment and Plan:  # Intracranial hemorrhage Intraparenchymal, subdural and subarachnoid hemorrhage right frontal pole CT head as above Continue Keppra 750 mg IV twice daily, transition to oral when patient is able to swallow.  Continue Keppra for 1 week for seizure prophylaxis as per neurosurgery Keep SBP less than 140 CT head repeated on 3/17, no changes from prior CT scan Continue fall precautions, aspiration precautions PT and OT eval done, recommended long-term placement Neurosurgery is following, recommended no intervention but would like to know about goals of care.  Keppra 750 mg twice daily for seizure prophylaxis for 1 week, hold all antiplatelet therapy for at least 1 week, DVT prophylaxis can be started after 72 hours from the last CT scan. Palliative care consulted for goals of care discussion   # UTI due to E. coli ESBL S/p  ceftriaxone, on 3/19 meropenem 1 g IV twice daily Use Tylenol as needed for fever Follow blood culture  # Isotonic hyponatremia, most likely nutritional deficiency serum osmolarity 282 WNL Monitor sodium level daily Continue IV fluid for hydration  # Hypokalemia, repleted with K-Phos # Hypophosphatemia, Phos repleted. Monitor electrolytes and replace as needed  Metabolic acidosis, bicarbonate 18 Started LR 75 mill per hour  Vitamin B12 deficiency, vitamin B12 level 176, goal >400, started vitamin B12 1000 mcg IM injection daily during hospital stay followed by oral supplement.  Hypothyroid on Synthroid Depression, continued risperidone and Zoloft Alzheimer's dementia, continue Aricept   There is no height or weight on file to calculate BMI.  Interventions:     Diet: N.p.o., SLP eval is pending DVT Prophylaxis: SCD, pharmacological prophylaxis contraindicated due to intracranial bleeding    Advance goals of care discussion: DNR  Family Communication: family was not present at bedside, at the time of interview.  Patient has Alzheimer's dementia, unable to comprehend and communicate. Palliative care consulted to discuss goals of care with family.  Patient is DNR.   Disposition:  Pt is from [place], admitted with fall and intracranial hemorrhage, still has risk of fall, n.p.o. on IV fluids, which precludes a safe discharge. Discharge to SNF versus hospice, TBD after palliative care evaluation. May need few days to improve TOC consulted for placement   Subjective: No significant events overnight, patient has significant dementia, unable to offer any complaints, seems to be resting comfortably.  Unable to respond to any commands.   Physical Exam: General: NAD, lying comfortably Appear in no distress, affect demented Eyes: PERRLA ENT: Oral Mucosa Clear,  dry Neck: no JVD, right temporal hematoma/bruise Cardiovascular: S1 and S2 Present, no Murmur,  Respiratory: good  respiratory effort, Bilateral Air entry equal and Decreased, no Crackles, no wheezes Abdomen: Bowel Sound present, Soft and no tenderness,  Skin: no rashes Extremities: no Pedal edema, no calf tenderness Neurologic: without any new focal findings Gait not checked due to patient safety concerns  Vitals:   11/21/22 0525 11/21/22 0754 11/21/22 1000 11/21/22 1200  BP: (!) 123/45 (!) 134/59    Pulse: 95 98    Resp: 18 18    Temp: (!) 100.4 F (38 C) (!) 100.9 F (38.3 C) 97.8 F (36.6 C) (!) 100.7 F (38.2 C)  TempSrc:   Axillary Oral  SpO2: 93% 93%    Weight:      Height:        Intake/Output Summary (Last 24 hours) at 11/21/2022 1244 Last data filed at 11/21/2022 0700 Gross per 24 hour  Intake 1000 ml  Output --  Net 1000 ml   Filed Weights   11/19/22 1500  Weight: 46 kg    Data Reviewed: I have personally reviewed and interpreted daily labs, tele strips, imagings as discussed above. I reviewed all nursing notes, pharmacy notes, vitals, pertinent old records I have discussed plan of care as described above with RN and patient/family.  CBC: Recent Labs  Lab 11/18/22 1512 11/19/22 0526 11/20/22 0550 11/21/22 0709  WBC 13.4* 10.1 7.0 9.6  NEUTROABS 11.7*  --   --   --   HGB 11.4* 11.5* 11.2* 11.0*  HCT 34.5* 35.1* 35.1* 32.5*  MCV 88.2 88.9 89.5 86.0  PLT 249 260 261 0000000   Basic Metabolic Panel: Recent Labs  Lab 11/18/22 1512 11/19/22 0910 11/20/22 0550 11/21/22 0709  NA 130* 129* 131* 131*  K 3.5 3.7 3.4* 3.4*  CL 101 102 102 101  CO2 22 23 18* 18*  GLUCOSE 88 92 80 87  BUN 16 13 10 13   CREATININE 0.59 0.61 0.55 0.74  CALCIUM 8.4* 8.1* 8.3* 8.6*  MG  --  2.0 1.9 1.8  PHOS  --  2.9 2.5 2.1*    Studies: No results found.  Scheduled Meds:   stroke: early stages of recovery book   Does not apply Once   cyanocobalamin  1,000 mcg Intramuscular Daily   Followed by   Derrill Memo ON 11/27/2022] vitamin B-12  1,000 mcg Oral Daily   donepezil  10 mg Oral Q  breakfast   [START ON 11/28/2022] levothyroxine  35 mcg Intravenous Daily   risperiDONE  0.5 mg Oral BID   sertraline  75 mg Oral Daily   sodium chloride flush  3 mL Intravenous Q12H   Continuous Infusions:  acetaminophen     lactated ringers 75 mL/hr at 11/21/22 0633   levETIRAcetam Stopped (11/21/22 1149)   meropenem (MERREM) IV 1 g (11/21/22 1209)   potassium PHOSPHATE IVPB (in mmol) 30 mmol (11/21/22 1141)   PRN Meds: acetaminophen **OR** acetaminophen, HYDROcodone-acetaminophen, morphine injection  Time spent: 35 minutes  Author: Val Riles. MD [facility] 11/21/2022 12:44 PM  To reach On-call, see care teams to locate the attending and reach out to them via [email or web address]. If 7PM-7AM, please contact night-coverage If you still have difficulty reaching the attending provider, please page the [facility] (Director on Call) for [facility] on amion for assistance.

## 2022-11-21 NOTE — Consult Note (Addendum)
Consultation Note Date: 11/21/2022   Patient Name: Mandy Reyes  DOB: 22-Sep-1940  MRN: [id number]  Age / Sex: 82 y.o., female  PCP: [facility] [place] Referring Physician: Val Riles, MD  Reason for Consultation: Establishing goals of care  HPI/Patient Profile: Mandy Reyes is an 82 y.o. female with PMH of Alzheimer's dementia, hypothyroid, depression, presented at [facility] ED on 11/18/2022 with fall. As per ED HPI patient had witnessed fall at her care home where she tripped on the stairs, striking her head without LOC. Pt. developed right scalp hematoma.   Clinical Assessment and Goals of Care: Notes and labs reviewed by myself.  In to see patient.  Patient is unresponsive to voice or touch.  No family at bedside.  No information listed under ACP tab. In demographics, son Alvester Chou is legal guardian. Attempted to call him x2 unsuccessfully.   SUMMARY OF RECOMMENDATIONS   Attempted to call son Alvester Chou x2 unsuccessfully. Will reattempt tomorrow.   At this time, patient would be a candidate for hospice care if family chooses this route.    Prognosis:  Very poor        Primary Diagnoses: Present on Admission:  Hypothyroidism  Dementia ([place])  Intracerebral hemorrhage ([place])  UTI (urinary tract infection)  ICH (intracerebral hemorrhage) ([place])   I have reviewed the medical record, interviewed the patient and family, and examined the patient. The following aspects are pertinent.  Past Medical History:  Diagnosis Date   Alzheimer's dementia [facility])    Social History   Socioeconomic History   Marital status: Single    Spouse name: Not on file   Number of children: Not on file   Years of education: Not on file   Highest education level: Not on file  Occupational History   Not on file  Tobacco Use   Smoking status: Former   Smokeless tobacco: Never  Vaping Use   Vaping  Use: Never used  Substance and Sexual Activity   Alcohol use: No   Drug use: No   Sexual activity: Not on file  Other Topics Concern   Not on file  Social History Narrative   Not on file   Social Determinants of Health   Financial Resource Strain: Not on file  Food Insecurity: Not on file  Transportation Needs: Not on file  Physical Activity: Not on file  Stress: Not on file  Social Connections: Not on file   History reviewed. No pertinent family history. Scheduled Meds:   stroke: early stages of recovery book   Does not apply Once   cyanocobalamin  1,000 mcg Intramuscular Daily   Followed by   Derrill Memo ON 11/27/2022] vitamin B-12  1,000 mcg Oral Daily   donepezil  10 mg Oral Q breakfast   [START ON 11/28/2022] levothyroxine  35 mcg Intravenous Daily   risperiDONE  0.5 mg Oral BID   sertraline  75 mg Oral Daily   sodium chloride flush  3 mL Intravenous Q12H   Continuous Infusions:  lactated ringers 75  mL/hr at 11/21/22 [id number]   levETIRAcetam Stopped (11/21/22 1149)   meropenem (MERREM) IV 1 g (11/21/22 1209)   potassium PHOSPHATE IVPB (in mmol) 30 mmol (11/21/22 1141)   PRN Meds:.acetaminophen **OR** acetaminophen, HYDROcodone-acetaminophen, morphine injection Medications Prior to Admission:  Prior to Admission medications   Medication Sig Start Date End Date Taking? Authorizing Provider  cefdinir (OMNICEF) 250 MG/5ML suspension Take 12 mLs (600 mg total) by mouth daily for 7 days. 11/18/22 11/25/22 Yes Blake Divine, MD  diclofenac Sodium (VOLTAREN) 1 % GEL Apply 2 g topically 4 (four) times daily as needed.   Yes [provider]  ergocalciferol (VITAMIN D2) 1.25 MG (50000 UT) capsule Take 50,000 Units by mouth once a week. Given on Saturday   Yes [provider]  fluticasone (FLONASE) 50 MCG/ACT nasal spray Place 2 sprays into both nostrils daily.   Yes [provider]  levothyroxine (SYNTHROID) 100 MCG tablet Take 100 mcg by mouth daily before  breakfast.   Yes [provider]  melatonin 3 MG TABS tablet Take 3 mg by mouth at bedtime.   Yes [provider]  polyethylene glycol (MIRALAX / GLYCOLAX) 17 g packet Take 17 g by mouth daily as needed for severe constipation, moderate constipation or mild constipation.   Yes [provider]  risperiDONE (RISPERDAL) 1 MG tablet Take 1 mg by mouth 3 (three) times daily.   Yes [provider]  senna (SENOKOT) 8.6 MG tablet Take 1 tablet by mouth daily as needed for constipation.   Yes [provider]  sertraline (ZOLOFT) 50 MG tablet Take 50 mg by mouth 2 (two) times daily.   Yes [provider]  triamcinolone (KENALOG) 0.025 % cream Apply 1 Application topically 2 (two) times daily as needed.   Yes [provider]   No Known Allergies Review of Systems  Unable to perform ROS   Physical Exam Constitutional:      Comments: Eyes closed.   Pulmonary:     Effort: Pulmonary effort is normal.  Skin:    General: Skin is warm and dry.     Vital Signs: BP (!) 134/59 (BP Location: Left Arm)   Pulse 98   Temp (!) 100.7 F (38.2 C) (Oral)   Resp 18   Ht 5\' 2"  (1.575 m)   Wt 46 kg   SpO2 93%   BMI 18.56 kg/m  Pain Scale: PAINAD       SpO2: SpO2: 93 % O2 Device:SpO2: 93 % O2 Flow Rate: .   IO: Intake/output summary:  Intake/Output Summary (Last 24 hours) at 11/21/2022 1229 Last data filed at 11/21/2022 0700 Gross per 24 hour  Intake 1000 ml  Output --  Net 1000 ml    LBM: Last BM Date : 11/19/22 Baseline Weight: Weight: 46 kg Most recent weight: Weight: 46 kg        Signed by: Asencion Gowda, NP   Please contact Palliative Medicine Team phone at [phone] for questions and concerns.  For individual provider: See Shea Evans

## 2022-11-22 DIAGNOSIS — L03811 Cellulitis of head [any part, except face]: Secondary | ICD-10-CM

## 2022-11-22 DIAGNOSIS — S0003XA Contusion of scalp, initial encounter: Secondary | ICD-10-CM

## 2022-11-22 DIAGNOSIS — S06340A Traumatic hemorrhage of right cerebrum without loss of consciousness, initial encounter: Secondary | ICD-10-CM | POA: Diagnosis not present

## 2022-11-22 LAB — GLUCOSE, CAPILLARY
Glucose-Capillary: 93 mg/dL (ref 70–99)
Glucose-Capillary: 78 mg/dL (ref 70–99)
Glucose-Capillary: 88 mg/dL (ref 70–99)
Glucose-Capillary: 79 mg/dL (ref 70–99)
Glucose-Capillary: 80 mg/dL (ref 70–99)

## 2022-11-22 LAB — BASIC METABOLIC PANEL
Anion gap: 6 (ref 5–15)
BUN: 10 mg/dL (ref 8–23)
CO2: 21 mmol/L — ABNORMAL LOW (ref 22–32)
Calcium: 8 mg/dL — ABNORMAL LOW (ref 8.9–10.3)
Chloride: 105 mmol/L (ref 98–111)
Creatinine, Ser: 0.52 mg/dL (ref 0.44–1.00)
GFR, Estimated: >60 mL/min (ref >60)
Glucose, Bld: 85 mg/dL (ref 70–99)
Potassium: 3.5 mmol/L (ref 3.5–5.1)
Sodium: 132 mmol/L — ABNORMAL LOW (ref 135–145)

## 2022-11-22 LAB — CBC
HCT: 31.4 % — ABNORMAL LOW (ref 36.0–46.0)
Hemoglobin: 10.9 g/dL — ABNORMAL LOW (ref 12.0–15.0)
MCH: 29.6 pg (ref 26.0–34.0)
MCHC: 34.7 g/dL (ref 30.0–36.0)
MCV: 85.3 fL (ref 80.0–100.0)
Platelets: 255 10*3/uL (ref 150–400)
RBC: 3.68 MIL/uL — ABNORMAL LOW (ref 3.87–5.11)
RDW: 15.6 % — ABNORMAL HIGH (ref 11.5–15.5)
WBC: 7.6 10*3/uL (ref 4.0–10.5)
nRBC: 0 % (ref 0.0–0.2)

## 2022-11-22 LAB — PHOSPHORUS: Phosphorus: 3 mg/dL (ref 2.5–4.6)

## 2022-11-22 LAB — MAGNESIUM: Magnesium: 1.6 mg/dL — ABNORMAL LOW (ref 1.7–2.4)

## 2022-11-22 MED ORDER — MORPHINE SULFATE (PF) 2 MG/ML IV SOLN: 2.0000 mg | INTRAVENOUS | Status: DC | PRN

## 2022-11-22 MED ORDER — MAGNESIUM SULFATE 2 GM/50ML IV SOLN
2.0000 g | Freq: Once | INTRAVENOUS | Status: AC
  Administered 2022-11-22: 2 g via INTRAVENOUS
  Filled 2022-11-22: qty 50

## 2022-11-22 MED ORDER — DEXTROSE IN LACTATED RINGERS 5 % IV SOLN: INTRAVENOUS | Status: DC

## 2022-11-22 MED ORDER — DEXTROSE 50 % IV SOLN: 12.5000 g | INTRAVENOUS | Status: DC

## 2022-11-22 NOTE — Progress Notes (Signed)
Patient reports given to Davanea RN at inpatient hospice facility. All questions addressed.

## 2022-11-22 NOTE — Progress Notes (Signed)
Spoke with patient's two sons, and grand-daughter. Legal Guardian Alvester Chou not present, but had communicated intent to pursue comfort and hospice care. All are in agreement with plan. Discussed death and dying process, as well as interventions to assist with comfort. All questions answered.

## 2022-11-22 NOTE — TOC Transition Note (Addendum)
Transition of Care [facility]) - CM/SW Discharge Note   Patient Details  Name: Mandy Reyes MRN: [id number] Date of Birth: June 20, 1941  [facility]) CM/SW Contact:  Ross Ludwig, LCSW Phone Number: 11/22/2022, 6:04 PM   Clinical Narrative:     Patient to be d/c'ed today to [facility] in [place].  Patient and family agreeable to plans will transport via ems RN to call report.  CSW confirmed that EMS transport has been set up by [facility] for 8:30pm pick up.  TOC signing off.    Final next level of care: [place] Barriers to Discharge: Barriers Resolved   Patient Goals and CMS Choice CMS Medicare.gov Compare Post Acute Care list provided to:: Patient Choice offered to / list presented to : Patient  Discharge Placement                Patient chooses bed at: Other - please specify in the comment section below: ([place] home.) Patient to be transferred to facility by: [facility] Name of family member notified: Sons have been notified and are aware she is discharging today. Patient and family notified of of transfer: 11/22/22  Discharge Plan and Services Additional resources added to the After Visit Summary for       Post Acute Care Choice: Hospice                               Social Determinants of Health (SDOH) Interventions SDOH Screenings   Tobacco Use: Medium Risk (11/18/2022)     Readmission Risk Interventions     No data to display

## 2022-11-22 NOTE — Consult Note (Signed)
This unfortunate patient was noted to have some purulent drainage from the back of her scalp earlier today.  After shampooing and cleansing the left occipital area, I was able to evaluate & identify that there is no fluctuance.  There is some additional mobility of the scalp in the area of concern.  I suspect there were multiple pustules that drained into a crusted containment.  Which has since been cleared.  There are no areas of necrosis, and no defects in the skin consistent with a drain site. As there is no residual abscess at this time, there is nothing to incise and drain.  I recommend that we wash her scalp daily, and apply an absorbent dressing.  Most importantly maintain offloading of this area of the scalp.

## 2022-11-22 NOTE — Progress Notes (Addendum)
Daily Progress Note   Patient Name: Mandy Reyes       Date: 11/22/2022 DOB: 07/12/1941  Age: 82 y.o. MRN#: [id number] Attending Physician: Val Riles, MD Primary Care Physician: [place] Date: 11/18/2022  Reason for Consultation/Follow-up: Establishing goals of care  Subjective: Notes and labs reviewed.  In to see patient.  She is currently resting in bed, no family at bedside.  Patient did not respond to voice or touch.  Called to speak with son Alvester Chou who is listed in demographics as patient's legal guardian.  Alvester Chou states he has multiple siblings but confirms information in demographics tab, that he has guardianship of patient, which was completed through the clerk of courts office.  He states that his mother has severe dementia.  He states for a period of time, he tried to care for her himself but was unable to provide the care that she needed.  He states that she has been under hospice care twice, but outlived the hospice and so it was stopped.  He states his mother is a woman of faith and is just waiting for God to take her home.   We discussed her diagnoses, prognosis, GOC, EOL wishes disposition and options.  Created space and opportunity for patient  to explore thoughts and feelings regarding current medical information.   A detailed discussion was had today regarding advanced directives.  Concepts specific to code status, artifical feeding and hydration, IV antibiotics and rehospitalization were discussed.  The difference between an aggressive medical intervention path and a comfort care path was discussed.  Values and goals of care important to patient and family were attempted to be elicited.  Discussed limitations of medical interventions to  prolong quality of life in some situations and discussed the concept of human mortality.  Son states he would like to pursue hospice care and comfort measures moving forward for his mother.  Offered to speak to any other family members to answer questions, and he states everyone is on the same page and he will update them.  I completed a MOST form today with son Alvester Chou through [place].The patient's son outlined their wishes for the following treatment decisions:  Cardiopulmonary Resuscitation: Do Not Attempt Resuscitation (DNR/No CPR)  Medical Interventions: Comfort Measures: Keep clean, warm, and dry. Use medication by any  route, positioning, wound care, and other measures to relieve pain and suffering. Use oxygen, suction and manual treatment of airway obstruction as needed for comfort. Do not transfer to the hospital unless comfort needs cannot be met in current location.  Antibiotics: No antibiotics (use other measures to relieve symptoms)  IV Fluids: No IV fluids (provide other measures to ensure comfort)  Feeding Tube: No feeding tube     ADDENDUM: Spoke with granddaughter and son Cecilie Lowers via phone at bedside who are amenable to hospice with comfort focus.  They confirm Alvester Chou is Optician, dispensing, and there is agreement with shifting to comfort care with hospice placement.   Length of Stay: 3  Current Medications: Scheduled Meds:    stroke: early stages of recovery book   Does not apply Once   cyanocobalamin  1,000 mcg Intramuscular Daily   Followed by   Derrill Memo ON 11/27/2022] vitamin B-12  1,000 mcg Oral Daily   dextrose  12.5 g Intravenous STAT   donepezil  10 mg Oral Q breakfast   [START ON 11/28/2022] levothyroxine  35 mcg Intravenous Daily   risperiDONE  0.5 mg Oral BID   sertraline  75 mg Oral Daily   sodium chloride flush  3 mL Intravenous Q12H    Continuous Infusions:  dextrose 5% lactated ringers 75 mL/hr at 11/22/22 1046   levETIRAcetam 750 mg (11/22/22 0916)   meropenem  (MERREM) IV 1 g (11/22/22 1023)    PRN Meds: acetaminophen **OR** acetaminophen, HYDROcodone-acetaminophen, morphine injection  Physical Exam Constitutional:      Comments: Eyes closed.  Unresponsive to voice or touch.  Skin:    General: Skin is warm and dry.             Vital Signs: BP 128/84 (BP Location: Left Arm)   Pulse 62   Temp 97.8 F (36.6 C)   Resp 18   Ht 5\' 2"  (1.575 m)   Wt 46 kg   SpO2 96%   BMI 18.56 kg/m  SpO2: SpO2: 96 % O2 Device: O2 Device: Room Air O2 Flow Rate:    Intake/output summary:  Intake/Output Summary (Last 24 hours) at 11/22/2022 1411 Last data filed at 11/22/2022 0600 Gross per 24 hour  Intake 1645.84 ml  Output 200 ml  Net 1445.84 ml   LBM: Last BM Date : 11/19/22 Baseline Weight: Weight: 46 kg Most recent weight: Weight: 46 kg  Patient Active Problem List   Diagnosis Date Noted   UTI (urinary tract infection) 11/19/2022   ICH (intracerebral hemorrhage) ([place]) 11/19/2022   Intracerebral hemorrhage ([place]) 11/18/2022   Visual hallucinations 10/26/2015   Dementia ([place]) 08/05/2013   History of non anemic vitamin B12 deficiency 08/05/2013   Hypothyroidism 08/05/2013    Palliative Care Assessment & Plan    Recommendations/Plan: Son Alvester Chou who is listed as patient's legal guardian in demographics tab, and has confirmed the same, would like hospice care.  Code Status:    Code Status Orders  (From admission, onward)           Start     Ordered   11/19/22 0135  Do not attempt resuscitation (DNR)  Continuous       Question Answer Comment  If patient has no pulse and is not breathing Do Not Attempt Resuscitation   If patient has a pulse and/or is breathing: Medical Treatment Goals LIMITED ADDITIONAL INTERVENTIONS: Use medication/IV fluids and cardiac monitoring as indicated; Do not use intubation or mechanical ventilation (DNI), also provide comfort medications.  Transfer to Progressive/Stepdown as indicated, avoid Intensive Care.    Consent: Discussion documented in EHR or advanced directives reviewed      11/19/22 0134           Code Status History     Date Active Date Inactive Code Status Order ID Comments User Context   11/19/2022 0134 11/19/2022 0134 DNR [id number]  Para Skeans, MD ED   11/19/2022 0133 11/19/2022 0134 DNR [id number]  Para Skeans, MD ED   11/18/2022 2249 11/19/2022 0132 DNR [id number]  Cuthriell, Charline Bills, PA-C ED      Advance Directive Documentation    Flowsheet Row Most Recent Value  Type of Advance Directive Out of facility DNR (pink MOST or yellow form)  Pre-existing out of facility DNR order (yellow form or pink MOST form) Yellow form placed in chart (order not valid for inpatient use)  "MOST" Form in Place? --       Prognosis:  < 2 weeks    Care plan was discussed with care team via epic chat  Thank you for allowing the Palliative Medicine Team to assist in the care of this patient.    Asencion Gowda, NP  Please contact Palliative Medicine Team phone at [phone] for questions and concerns.

## 2022-11-22 NOTE — Progress Notes (Signed)
[facility] Progress Note  Patient: Mandy Reyes    [id number]  DOA: 11/18/2022     Date of Service: the patient was seen and examined on 11/22/2022  Chief Complaint  Patient presents with   Fall   Brief hospital course: Mandy Reyes is an 82 y.o. female  with PMH of Alzheimer's dementia, hypothyroid, depression, presented at [facility] ED on 11/18/2022 with fall.  As per ED HPI patient had witnessed fall at her care home where she tripped on the stairs, striking her head without LOC. Pt. developed right scalp hematoma.   ED workup: CT Head showed Small volume subdural and subarachnoid hemorrhage about the right frontal pole and Sylvian fissure. No overlying skull fracture. Soft tissue contusion and laceration of the left scalp vertex. CTA: No intracranial arterial occlusion or hemodynamically significant stenosis. Unchanged appearance of right frontal operculum intraparenchymal hematoma and small volume right convexity subarachnoid blood  Neurosurgery was consulted, recommended to admit on observation and further management as below.  Assessment and Plan:  # Intracranial hemorrhage Intraparenchymal, subdural and subarachnoid hemorrhage right frontal pole CT head as above Continue Keppra 750 mg IV twice daily, transition to oral when patient is able to swallow.  Continue Keppra for 1 week for seizure prophylaxis as per neurosurgery Keep SBP less than 140 CT head repeated on 3/17, no changes from prior CT scan Continue fall precautions, aspiration precautions PT and OT eval done, recommended long-term placement Neurosurgery is following, recommended no intervention but would like to know about goals of care.  Keppra 750 mg twice daily for seizure prophylaxis for 1 week, hold all antiplatelet therapy for at least 1 week, DVT prophylaxis can be started after 72 hours from the last CT scan. Palliative care consulted for goals of care discussion   # UTI due to E. coli ESBL S/p  ceftriaxone, on 3/19 meropenem 1 g IV twice daily Use Tylenol as needed for fever Follow blood culture NGTD ID consulted  # Occipital abscess 3/20 RN noticed pus drainage from occipital area Continue dressing General surgery consulted for possible I&D/debridement ID consulted for further recommendation of antibiotics   # Isotonic hyponatremia, most likely nutritional deficiency serum osmolarity 282 WNL Monitor sodium level daily Continue IV fluid for hydration  # Hypomagnesemia, mag repleted. # Hypokalemia, repleted with K-Phos # Hypophosphatemia, Phos repleted. Monitor electrolytes and replace as needed  # Metabolic acidosis, bicarbonate 18 Started LR 75 mill per hour  # Vitamin B12 deficiency, vitamin B12 level 176, goal >400, started vitamin B12 1000 mcg IM injection daily during hospital stay followed by oral supplement.  # Hypothyroid on Synthroid # Depression, continued risperidone and Zoloft # Alzheimer's dementia, continue Aricept  Body mass index is 18.56 kg/m. Interventions:     Diet: N.p.o., SLP eval is pending DVT Prophylaxis: SCD, pharmacological prophylaxis contraindicated due to intracranial bleeding    Advance goals of care discussion: DNR  Family Communication: family was not present at bedside, at the time of interview.  Patient has Alzheimer's dementia, unable to comprehend and communicate. Palliative care consulted to discuss goals of care with family.  Patient is DNR.   Disposition:  Pt is from [place], admitted with fall and intracranial hemorrhage, still has risk of fall, n.p.o. on IV fluids, which precludes a safe discharge. Discharge to SNF versus hospice, TBD after palliative care evaluation. May need few days to improve TOC consulted for placement   Subjective: No significant events overnight, patient has significant dementia, unable to offer any  complaints, seems to be resting comfortably.  Did not respond to any  commands.   Physical Exam: General: NAD, lying comfortably Appear in no distress, affect demented Eyes: PERRLA ENT: Oral Mucosa Clear, dry Neck: no JVD, right temporal hematoma/bruise Cardiovascular: S1 and S2 Present, no Murmur,  Respiratory: good respiratory effort, Bilateral Air entry equal and Decreased, no Crackles, no wheezes Abdomen: Bowel Sound present, Soft and no tenderness,  Skin: no rashes, occipital abscess draining puss noticed on 3/20  Extremities: no Pedal edema, no calf tenderness Neurologic: without any new focal findings Gait not checked due to patient safety concerns  Vitals:   11/21/22 1613 11/21/22 2017 11/22/22 0552 11/22/22 0753  BP: (!) 108/59 (!) 135/59 (!) 141/69 128/84  Pulse: 76 76 (!) 59 62  Resp: 16 20 18 18   Temp: 99 F (37.2 C) 97.7 F (36.5 C) 98.6 F (37 C) 97.8 F (36.6 C)  TempSrc: Oral     SpO2: 93% 94% 93% 96%  Weight:      Height:        Intake/Output Summary (Last 24 hours) at 11/22/2022 1513 Last data filed at 11/22/2022 0600 Gross per 24 hour  Intake 1645.84 ml  Output 200 ml  Net 1445.84 ml   Filed Weights   11/19/22 1500  Weight: 46 kg    Data Reviewed: I have personally reviewed and interpreted daily labs, tele strips, imagings as discussed above. I reviewed all nursing notes, pharmacy notes, vitals, pertinent old records I have discussed plan of care as described above with RN and patient/family.  CBC: Recent Labs  Lab 11/18/22 1512 11/19/22 0526 11/20/22 0550 11/21/22 0709 11/22/22 0808  WBC 13.4* 10.1 7.0 9.6 7.6  NEUTROABS 11.7*  --   --   --   --   HGB 11.4* 11.5* 11.2* 11.0* 10.9*  HCT 34.5* 35.1* 35.1* 32.5* 31.4*  MCV 88.2 88.9 89.5 86.0 85.3  PLT 249 260 261 293 [id number]   Basic Metabolic Panel: Recent Labs  Lab 11/18/22 1512 11/19/22 0910 11/20/22 0550 11/21/22 0709 11/22/22 0728  NA 130* 129* 131* 131* 132*  K 3.5 3.7 3.4* 3.4* 3.5  CL 101 102 102 101 105  CO2 22 23 18* 18* 21*  GLUCOSE 88  92 80 87 85  BUN 16 13 10 13 10   CREATININE 0.59 0.61 0.55 0.74 0.52  CALCIUM 8.4* 8.1* 8.3* 8.6* 8.0*  MG  --  2.0 1.9 1.8 1.6*  PHOS  --  2.9 2.5 2.1* 3.0    Studies: No results found.  Scheduled Meds:   stroke: early stages of recovery book   Does not apply Once   cyanocobalamin  1,000 mcg Intramuscular Daily   Followed by   Derrill Memo ON 11/27/2022] vitamin B-12  1,000 mcg Oral Daily   dextrose  12.5 g Intravenous STAT   donepezil  10 mg Oral Q breakfast   [START ON 11/28/2022] levothyroxine  35 mcg Intravenous Daily   risperiDONE  0.5 mg Oral BID   sertraline  75 mg Oral Daily   sodium chloride flush  3 mL Intravenous Q12H   Continuous Infusions:  dextrose 5% lactated ringers 75 mL/hr at 11/22/22 1046   levETIRAcetam 750 mg (11/22/22 0916)   meropenem (MERREM) IV 1 g (11/22/22 1023)   PRN Meds: acetaminophen **OR** acetaminophen, HYDROcodone-acetaminophen, morphine injection  Time spent: 50 minutes  Author: Val Riles. MD [facility] 11/22/2022 3:13 PM  To reach On-call, see care teams to locate the attending and reach out to  them via [email or web address]. If 7PM-7AM, please contact night-coverage If you still have difficulty reaching the attending provider, please page the [facility] (Director on Call) for [facility] on amion for assistance.

## 2022-11-22 NOTE — Discharge Summary (Signed)
[facility] Discharge Summary   Patient: Mandy Reyes [id number]  PCP: [facility] [place]  Date of admission: 11/18/2022   Date of discharge:  11/22/2022     Discharge Diagnoses:  Principal Problem:   ICH (intracerebral hemorrhage) ([place]) Active Problems:   Dementia ([place])   Hypothyroidism   Intracerebral hemorrhage ([place])   UTI (urinary tract infection)   Abscess or cellulitis of scalp   Hematoma of scalp   Admitted From: Care Home Disposition:  [place]  Recommendations for Outpatient Follow-up:  Hospice Care Follow up LABS/TEST:  none   Follow-up [facility] at [facility]   Specialty: Emergency Medicine Why: If symptoms worsen Contact information: [place] [id number] [place] [phone]        Schedule an appointment as soon as possible for a visit  with [facility] [place].   Specialty: General Practice Contact information: [place] [phone]                Diet recommendation: NPO   Activity: The patient is advised to gradually reintroduce usual activities, as tolerated  Discharge Condition: stable  Code Status: DNR   History of present illness: As per the H and P dictated on admission Hospital Course:  Mandy Reyes is an 82 y.o. female  with PMH of Alzheimer's dementia, hypothyroid, depression, presented at [facility] ED on 11/18/2022 with fall.  As per ED HPI patient had witnessed fall at her care home where she tripped on the stairs, striking her head without LOC. Pt. developed right scalp hematoma.   ED workup: CT Head showed Small volume subdural and subarachnoid hemorrhage about the right frontal pole and Sylvian fissure. No overlying skull fracture. Soft tissue contusion and laceration of the left scalp vertex. CTA: No intracranial arterial occlusion or  hemodynamically significant stenosis. Unchanged appearance of right frontal operculum intraparenchymal hematoma and small volume right convexity subarachnoid blood Neurosurgery was consulted, recommended to admit on observation and further management as below.  Assessment and Plan: # Intracranial hemorrhage Intraparenchymal, subdural and subarachnoid hemorrhage right frontal pole CT head as above. S/p eppra 750 mg IV twice daily. Keep SBP less than 140.  CT head repeated on 3/17, no changes from prior CT scan Continue fall precautions, aspiration precautions PT and OT eval done, recommended long-term placement Neurosurgery is following, recommended no intervention but would like to know about goals of care.  Keppra 750 mg twice daily for seizure prophylaxis for 1 week, hold all antiplatelet therapy for at least 1 week, DVT prophylaxis can be started after 72 hours from the last CT scan. Now patient is comfort care so discontinued on medications. Palliative care consulted, pt got accepted at hospice facility to be transferred today.  # UTI due to E. coli ESBL S/p ceftriaxone, on 3/19 meropenem 1 g IV twice daily.  Blood culture negative, # Occipital abscess, 3/20 RN noticed pus drainage from occipital area.  General surgery consulted, rec to wash the are with shampoo. There is no collection to do  I&D/debridement # Isotonic hyponatremia, most likely nutritional deficiency, serum osmolarity 282 WNL s/pIV fluid for hydration # Hypomagnesemia, mag repleted. # Hypokalemia, repleted with K-Phos # Hypophosphatemia, Phos repleted. # Metabolic acidosis, bicarbonate 18, s/p LR 75 mill per hour # Vitamin B12 deficiency, vitamin B12 level 176, goal >400, s/p vitamin B12 1000 mcg IM injection daily.  # Hypothyroid on  Synthroid # Depression, continued risperidone and Zoloft # Alzheimer's dementia, continue Aricept Body mass index is 18.56 kg/m.  Nutrition Problem: Severe Malnutrition Etiology: chronic  illness (dementia) Nutrition Interventions: Interventions: Refer to RD note for recommendations  Patient is appropriate for discharge to hospice service.  Consultants: Neurosurgery, general surgery, ID Procedures: None  Discharge Exam: General: Appear in no distress, no Rash; Oral Mucosa Clear, dry. Cardiovascular: S1 and S2 Present, no Murmur, Respiratory: normal respiratory effort, Bilateral Air entry present and no Crackles, no wheezes Abdomen: Bowel Sound present, Soft and no tenderness, no hernia Extremities: no Pedal edema, no calf tenderness Neurology: awake, not alert and not oriented at all. No response at all   affect flat   Filed Weights   11/19/22 1500  Weight: 46 kg   Vitals:   11/22/22 0753 11/22/22 1547  BP: 128/84 (!) 158/144  Pulse: 62 69  Resp: 18 12  Temp: 97.8 F (36.6 C) 98.2 F (36.8 C)  SpO2: 96% 97%    DISCHARGE MEDICATION: Allergies as of 11/22/2022   No Known Allergies      Medication List     STOP taking these medications    diclofenac Sodium 1 % Gel Commonly known as: VOLTAREN   ergocalciferol 1.25 MG (50000 UT) capsule Commonly known as: VITAMIN D2   fluticasone 50 MCG/ACT nasal spray Commonly known as: FLONASE   levothyroxine 100 MCG tablet Commonly known as: SYNTHROID   melatonin 3 MG Tabs tablet   polyethylene glycol 17 g packet Commonly known as: MIRALAX / GLYCOLAX   risperiDONE 1 MG tablet Commonly known as: RISPERDAL   senna 8.6 MG tablet Commonly known as: SENOKOT   sertraline 50 MG tablet Commonly known as: ZOLOFT   triamcinolone 0.025 % cream Commonly known as: KENALOG       No Known Allergies Discharge Instructions     Diet - low sodium heart healthy   Complete by: As directed    Discharge instructions   Complete by: As directed    Hospice care   No wound care   Complete by: As directed        The results of significant diagnostics from this hospitalization (including imaging, microbiology,  ancillary and laboratory) are listed below for reference.    Significant Diagnostic Studies: CT HEAD WO CONTRAST  Result Date: 11/19/2022 CLINICAL DATA:  Fall with intracranial hemorrhage EXAM: CT HEAD WITHOUT CONTRAST TECHNIQUE: Contiguous axial images were obtained from the base of the skull through the vertex without intravenous contrast. RADIATION DOSE REDUCTION: This exam was performed according to the departmental dose-optimization program which includes automated exposure control, adjustment of the mA and/or kV according to patient size and/or use of iterative reconstruction technique. COMPARISON:  Yesterday FINDINGS: Brain: Hematoma in the right temporal operculum measuring up to 3 cm, unchanged. Mild adjacent edema. Regional subarachnoid hemorrhage is unchanged. On coronal reformats there is question of a trace subdural collection which is not high density, 2 mm in thickness. Cerebral volume loss especially at the temporal lobes. No evidence of infarct or mass. Vascular: No hyperdense vessel or unexpected calcification. Skull: Normal. Negative for fracture or focal lesion. Sinuses/Orbits: No acute finding. IMPRESSION: Unchanged intracranial hemorrhage on the right. No worrisome mass effect. No new abnormality. Electronically Signed   By: Jorje Guild M.D.   On: 11/19/2022 09:08   CT ANGIO HEAD W OR WO CONTRAST  Result Date: 11/18/2022 CLINICAL DATA:  Subarachnoid hemorrhage EXAM: CT ANGIOGRAPHY HEAD TECHNIQUE: Multidetector CT imaging of the head was  performed using the standard protocol during bolus administration of intravenous contrast. Multiplanar CT image reconstructions and MIPs were obtained to evaluate the vascular anatomy. RADIATION DOSE REDUCTION: This exam was performed according to the departmental dose-optimization program which includes automated exposure control, adjustment of the mA and/or kV according to patient size and/or use of iterative reconstruction technique. CONTRAST:   55mL OMNIPAQUE IOHEXOL 350 MG/ML SOLN COMPARISON:  None Available. FINDINGS: POSTERIOR CIRCULATION: --Vertebral arteries: Normal --Inferior cerebellar arteries: Normal. --Basilar artery: Diminutive basilar artery in the context of bilateral fetal PCAs. --Superior cerebellar arteries: Normal. --Posterior cerebral arteries: Normal. Both are predominantly supplied by the posterior communicating arteries (p-comm). ANTERIOR CIRCULATION: --Intracranial internal carotid arteries: Atherosclerotic calcification of the internal carotid arteries at the skull base without hemodynamically significant stenosis. --Anterior cerebral arteries (ACA): Normal. --Middle cerebral arteries (MCA): Normal. Unchanged appearance of right frontal operculum intraparenchymal hematoma head and small volume right convexity subarachnoid blood. Review of the MIP images confirms the above findings. IMPRESSION: 1. No intracranial arterial occlusion or hemodynamically significant stenosis. 2. Unchanged appearance of right frontal operculum intraparenchymal hematoma and small volume right convexity subarachnoid blood. Electronically Signed   By: Ulyses Jarred M.D.   On: 11/18/2022 23:29   CT Head Wo Contrast  Result Date: 11/18/2022 CLINICAL DATA:  Follow-up intracranial hemorrhage EXAM: CT HEAD WITHOUT CONTRAST TECHNIQUE: Contiguous axial images were obtained from the base of the skull through the vertex without intravenous contrast. RADIATION DOSE REDUCTION: This exam was performed according to the departmental dose-optimization program which includes automated exposure control, adjustment of the mA and/or kV according to patient size and/or use of iterative reconstruction technique. COMPARISON:  CT brain 11/18/2022, 01/15/2019 FINDINGS: Brain: Interval development of acute intraparenchymal hemorrhage within the right frontal operculum measuring 3.2 x 2.4 by 2.1 cm, estimated volume of 8.1 cc. Moderate surrounding edema. No measurable midline  shift at this time. Slight interval increase in small volume subarachnoid blood over the right frontal and temporal lobes. Possible small focus of cortical contusion right temporal lobe, series 2, image 10. Small volume acute hemorrhage in the right sylvian fissure as before. Stable ventricle size. Atrophy and chronic small vessel ischemic changes of the white matter. Vascular: No hyperdense vessels.  Carotid vascular calcification Skull: Normal. Negative for fracture or focal lesion. Sinuses/Orbits: No acute finding. Mucosal thickening in the sinuses. Other: Right supraorbital scalp swelling. Left posterior scalp laceration and hematoma. IMPRESSION: 1. Interval development of acute intraparenchymal hemorrhage within the right frontal operculum measuring up to 3.2 cm. Moderate surrounding edema but no measurable midline shift at this time. Slight interval increase in small volume subarachnoid blood over the right frontal and temporal lobes. Possible small focus of cortical contusion right temporal lobe. 2. Atrophy and chronic small vessel ischemic changes of the white matter. Critical Value/emergent results were called by telephone at the time of interpretation on 11/18/2022 at 9:44 pm to provider Roderic Palau in ED , who verbally acknowledged these results. Electronically Signed   By: Donavan Foil M.D.   On: 11/18/2022 21:44   CT Cervical Spine Wo Contrast  Result Date: 11/18/2022 CLINICAL DATA:  Fall with neck pain. EXAM: CT CERVICAL SPINE WITHOUT CONTRAST TECHNIQUE: Multidetector CT imaging of the cervical spine was performed without intravenous contrast. Multiplanar CT image reconstructions were also generated. RADIATION DOSE REDUCTION: This exam was performed according to the departmental dose-optimization program which includes automated exposure control, adjustment of the mA and/or kV according to patient size and/or use of iterative reconstruction technique. COMPARISON:  Cervical spine  radiographs dated  10/28/2021. FINDINGS: Alignment: Gentle kyphosis at C4-5 is likely positional or degenerative. No traumatic subluxation. Skull base and vertebrae: No acute fracture. No primary bone lesion or focal pathologic process. Soft tissues and spinal canal: No prevertebral fluid or swelling. No visible canal hematoma. Disc levels:  Multilevel degenerative disc and joint disease. Upper chest: Negative. Other: None. IMPRESSION: 1. No acute osseous injury of the cervical spine. Electronically Signed   By: Zerita Boers M.D.   On: 11/18/2022 16:23   DG Chest Portable 1 View  Result Date: 11/18/2022 CLINICAL DATA:  Fall EXAM: PORTABLE CHEST 1 VIEW COMPARISON:  01/15/2019 FINDINGS: No acute airspace disease or pleural effusion. Normal cardiomediastinal silhouette with aortic atherosclerosis. Negative for pneumothorax. IMPRESSION: No active disease. Electronically Signed   By: Donavan Foil M.D.   On: 11/18/2022 15:53   CT Head Wo Contrast  Result Date: 11/18/2022 CLINICAL DATA:  Fall EXAM: CT HEAD WITHOUT CONTRAST TECHNIQUE: Multidetector CT imaging of the head spine was performed following the standard protocol without intravenous contrast. RADIATION DOSE REDUCTION: This exam was performed according to the departmental dose-optimization program which includes automated exposure control, adjustment of the mA and/or kV according to patient size and/or use of iterative reconstruction technique. COMPARISON:  01/15/2019 FINDINGS: CT HEAD FINDINGS Brain: Small volume subdural and subarachnoid hemorrhage about the right frontal pole and sylvian fissure (series 2, image 8). No evidence of acute infarction, hydrocephalus, extra-axial collection or mass lesion/mass effect. Periventricular and deep white matter hypodensity. Vascular: No hyperdense vessel or unexpected calcification. Skull: Normal. Negative for fracture or focal lesion. Sinuses/Orbits: No acute finding. Other: Soft tissue contusion and laceration of the left scalp  vertex (series 2, image 18). IMPRESSION: 1. Small volume subdural and subarachnoid hemorrhage about the right frontal pole and Sylvian fissure. No overlying skull fracture. 2. Soft tissue contusion and laceration of the left scalp vertex. These results were called by telephone at the time of interpretation on 11/18/2022 at 3:09 pm to Dr Blake Divine , who verbally acknowledged these results. Electronically Signed   By: Delanna Ahmadi M.D.   On: 11/18/2022 15:11    Microbiology: Recent Results (from the past 240 hour(s))  Urine Culture     Status: Abnormal   Collection Time: 11/18/22  3:41 PM   Specimen: Urine, Clean Catch  Result Value Ref Range Status   Specimen Description   Final    URINE, CLEAN CATCH Performed at [facility], [address], [place], [place] [address]    Special Requests   Final    NONE Performed at [facility], [address] [place], [place] [address]    Culture (A)  Final    >=100,000 COLONIES/mL ESCHERICHIA COLI Confirmed Extended Spectrum Beta-Lactamase Producer (ESBL).  In bloodstream infections from ESBL organisms, carbapenems are preferred over piperacillin/tazobactam. They are shown to have a lower risk of mortality.    Report Status 11/21/2022 FINAL  Final   Organism ID, Bacteria ESCHERICHIA COLI (A)  Final      Susceptibility   Escherichia coli - MIC*    AMPICILLIN >=32 RESISTANT Resistant     CEFAZOLIN >=64 RESISTANT Resistant     CEFEPIME 16 RESISTANT Resistant     CEFTRIAXONE >=64 RESISTANT Resistant     CIPROFLOXACIN >=4 RESISTANT Resistant     GENTAMICIN <=1 SENSITIVE Sensitive     IMIPENEM <=0.25 SENSITIVE Sensitive     NITROFURANTOIN <=16 SENSITIVE Sensitive     TRIMETH/SULFA >=320 RESISTANT Resistant  AMPICILLIN/SULBACTAM 16 INTERMEDIATE Intermediate     PIP/TAZO <=4 SENSITIVE Sensitive     * >=100,000 COLONIES/mL ESCHERICHIA COLI  Culture, blood (Routine X 2) w Reflex to ID Panel     Status: None (Preliminary  result)   Collection Time: 11/21/22  9:21 AM   Specimen: BLOOD  Result Value Ref Range Status   Specimen Description   Final    BLOOD LEFT HAND  Performed at [facility], [address] [place], [place] [address]    Special Requests   Final    BOTTLES DRAWN AEROBIC ONLY Blood Culture adequate volume Performed at [facility], [address], [place], [place] [address]    Culture   Final    NO GROWTH < 24 HOURS Performed at [facility], [place] [address], [place], [place] [address]    Report Status PENDING  Incomplete  Culture, blood (Routine X 2) w Reflex to ID Panel     Status: None (Preliminary result)   Collection Time: 11/21/22  9:21 AM   Specimen: BLOOD  Result Value Ref Range Status   Specimen Description   Final    BLOOD RIGHT HAND Performed at [facility], [address] [place], [place] [address]    Special Requests   Final    BOTTLES DRAWN AEROBIC AND ANAEROBIC Blood Culture adequate volume Performed at [facility], [address] [place], [place] [address]    Culture   Final    NO GROWTH < 24 HOURS Performed at [facility], [place] [address], [place], [place] [address]    Report Status PENDING  Incomplete     Labs: CBC: Recent Labs  Lab 11/18/22 1512 11/19/22 0526 11/20/22 0550 11/21/22 0709 11/22/22 0808  WBC 13.4* 10.1 7.0 9.6 7.6  NEUTROABS 11.7*  --   --   --   --   HGB 11.4* 11.5* 11.2* 11.0* 10.9*  HCT 34.5* 35.1* 35.1* 32.5* 31.4*  MCV 88.2 88.9 89.5 86.0 85.3  PLT 249 260 261 293 [id number]   Basic Metabolic Panel: Recent Labs  Lab 11/18/22 1512 11/19/22 0910 11/20/22 0550 11/21/22 0709 11/22/22 0728  NA 130* 129* 131* 131* 132*  K 3.5 3.7 3.4* 3.4* 3.5  CL 101 102 102 101 105  CO2 22 23 18* 18* 21*  GLUCOSE 88 92 80 87 85  BUN 16 13 10 13 10   CREATININE 0.59 0.61 0.55 0.74 0.52  CALCIUM 8.4* 8.1* 8.3* 8.6* 8.0*  MG  --  2.0 1.9 1.8 1.6*  PHOS  --  2.9 2.5 2.1* 3.0   Liver Function  Tests: No results for input(s): "AST", "ALT", "ALKPHOS", "BILITOT", "PROT", "ALBUMIN" in the last 168 hours. No results for input(s): "LIPASE", "AMYLASE" in the last 168 hours. No results for input(s): "AMMONIA" in the last 168 hours. Cardiac Enzymes: No results for input(s): "CKTOTAL", "CKMB", "CKMBINDEX", "TROPONINI" in the last 168 hours. BNP (last 3 results) No results for input(s): "BNP" in the last 8760 hours. CBG: Recent Labs  Lab 11/22/22 0850 11/22/22 1145 11/22/22 1549  GLUCAP 93 78 88    Time spent: 35 minutes  Signed:  Val Riles  [facility]  11/22/2022 4:53 PM

## 2022-11-22 NOTE — Progress Notes (Signed)
Physical Therapy Treatment Patient Details Name: Mandy Reyes MRN: [id number] DOB: [birth year] Today's Date: 11/22/2022   History of Present Illness PURPOSE BARCA is an 82 y.o. female  seen today after fall . Pt has dementia but is ambulatory at baseline and was wandering outside and fall and hit her head with laceration to back of her head and hematoma on right side of forehead. Fall was down 2 steps.    PT Comments    Pt is not making progress towards goals and of note at time of writing this note, pt now on comfort care. Pt sleeping upon arrival and demonstrated poor participation in today's session. Pt soiled and assisted CNA with hygiene including rolling, donning new gown. Will dc in house at this time.   Recommendations for follow up therapy are one component of a multi-disciplinary discharge planning process, led by the attending physician.  Recommendations may be updated based on patient status, additional functional criteria and insurance authorization.  Follow Up Recommendations  Long-term institutional care without follow-up therapy Can patient physically be transported by private vehicle: No   Assistance Recommended at Discharge Frequent or constant Supervision/Assistance  Patient can return home with the following A lot of help with walking and/or transfers;A lot of help with bathing/dressing/bathroom;Assist for transportation;Direct supervision/assist for medications management   Equipment Recommendations  None recommended by PT    Recommendations for Other Services       Precautions / Restrictions Precautions Precautions: Fall Restrictions Weight Bearing Restrictions: No     Mobility  Bed Mobility Overal bed mobility: Needs Assistance Bed Mobility: Rolling Rolling: Total assist         General bed mobility comments: doesn't follow commands, however secondary to being soiled in bed, required B rolling and +2 total assist for repositioning    Transfers                    General transfer comment: unable to perform    Ambulation/Gait                   Stairs             Wheelchair Mobility    Modified Rankin (Stroke Patients Only)       Balance                                            Cognition Arousal/Alertness: Lethargic Behavior During Therapy: Flat affect Overall Cognitive Status: History of cognitive impairments - at baseline                                 General Comments: pt sleeping upon arrival, however eyes do flutter with verbal/tactile stimulation. Is unable to maintain consistent alertness for participation.        Exercises Other Exercises Other Exercises: Pt with gown wet and incontient BM. Able to perform rolling to B sides with total assist. During rolling pt yells "quit it". Pt easily drifts off to sleep when not actively stimulated.    General Comments        Pertinent Vitals/Pain Pain Assessment Pain Assessment: No/denies pain    Home Living                          Prior Function  PT Goals (current goals can now be found in the care plan section) Acute Rehab PT Goals Patient Stated Goal: unable to state PT Goal Formulation: Patient unable to participate in goal setting Time For Goal Achievement: 11/22/22 Potential to Achieve Goals: Poor Progress towards PT goals: Not progressing toward goals - comment;Goals downgraded-see care plan    Frequency    Min 2X/week      PT Plan Discharge plan needs to be updated    Co-evaluation              AM-PAC PT "6 Clicks" Mobility   Outcome Measure  Help needed turning from your back to your side while in a flat bed without using bedrails?: Total Help needed moving from lying on your back to sitting on the side of a flat bed without using bedrails?: Total Help needed moving to and from a bed to a chair (including a wheelchair)?: Total Help needed standing  up from a chair using your arms (e.g., wheelchair or bedside chair)?: Total Help needed to walk in hospital room?: Total Help needed climbing 3-5 steps with a railing? : Total 6 Click Score: 6    End of Session   Activity Tolerance:  (limited by cognition) Patient left: in bed;with bed alarm set Nurse Communication: Mobility status PT Visit Diagnosis: Muscle weakness (generalized) (M62.81);Difficulty in walking, not elsewhere classified (R26.2);Other abnormalities of gait and mobility (R26.89);History of falling (Z91.81)     Time: 1347-1401 PT Time Calculation (min) (ACUTE ONLY): 14 min  Charges:  $Therapeutic Activity: 8-22 mins                     Mandy Reyes, PT, DPT, GCS [phone]    Vennessa Affinito 11/22/2022, 2:22 PM

## 2022-11-22 NOTE — Progress Notes (Signed)
Patient's family at bedside made aware of plan to transport via EMS to inpatient hospice at 2030.

## 2022-11-22 NOTE — Progress Notes (Signed)
Manufacturing engineer [facility]) Hospital Liaison Note  Received request from Transitions of Care Manager  Judson Roch for family interest in [place]. Visited patient at bedside and spoke with Alvester Chou to confirm interest and explain services.  Patient has been approved for transfer to the Hospice Home. Consent forms have been completed.  EMS notified of patient D/C and transport arranged by MSW for 8-8:30 pm pick up this evening. TOC/Sarah and Attending Physician/Dr. Dwyane Dee also notified of transport arrangement.   Please send signed DNR form with patient and RN call report to [phone].   Phillis Haggis, MSW [place] [facility] Liaison [phone]

## 2022-11-22 NOTE — Progress Notes (Addendum)
Speech Language Pathology Treatment: Dysphagia  Patient Details Name: Mandy Reyes MRN: [id number] DOB: 01-07-1941 Today's Date: 11/22/2022 Time: [id number] SLP Time Calculation (min) (ACUTE ONLY): 10 min  Assessment / Plan / Recommendation Clinical Impression  Pt seen today for dysphagia tx. Pt laying in bed w/ eyes closed upon ST arrival. Pt unresponsive to verbal rousing and min tactile stim. Pt repositioned to sit upright in bed to participate in oral care. Pt nonverbal t/o session and unresponsive to max verbal/visual/tactile stim. Pt left sitting up in bed w/ bed alarm set, call button in reach, and RN present. RN updated on pt's status Pt on RA; febrile; WBC WNL.   ST attempted oral care w/ pt, but pt was orally defensive despite max verbal/tactile cues. No po trials attempted d/t HIGH risk for aspiration. Pt continues to not be safe for po's at this time d/t pt's current cognitive presentation.   In the setting of pt's continued severely declined cognitive status and poor responsiveness, recommend continue NPO diet at this time. When/if pt is able to full arouse to safely participate in oral intake, MD can reconsult ST services. Recommend NSG provides oral care frequently. RN updated. Noted palliative care is following for GOC.  No further acute ST needs at this time. ST will s/o. MD to reconsult if any new needs arise during admission/if pt's cognitive presentation improves to be appropriate for oral intake.   HPI HPI: Per H&P, pt "is an 82 y.o. female  seen today after fall . Pt has dementia but is ambulatory at baseline and was wandering outside and fall and hit her head with laceration to back of her head and hematoma on right side of forehead. Fall was down 2 steps.  HPI is limited secondary to dementia patient has not per most name.  Chart review shows that podiatry visit states that patient was describing difficulty reaching her feet and painful nails so I am not sure about her  being nonverbal at baseline, I suspect she is a poor historian or so because of the dementia."      SLP Plan  All goals met      Recommendations for follow up therapy are one component of a multi-disciplinary discharge planning process, led by the attending physician.  Recommendations may be updated based on patient status, additional functional criteria and insurance authorization.    Recommendations  Diet recommendations: NPO Medication Administration: Via alternative means                Oral Care Recommendations: Oral care QID;Staff/trained caregiver to provide oral care Follow Up Recommendations: Skilled nursing-short term rehab (<3 hours/day) Assistance recommended at discharge: Frequent or constant Supervision/Assistance SLP Visit Diagnosis: Dysphagia, unspecified (R13.10);Cognitive communication deficit [id number]) Plan: All goals met         Randall Hiss Graduate Clinician [place], Speech Pathology   Randall Hiss  11/22/2022, 11:33 AM

## 2022-11-22 NOTE — TOC Initial Note (Signed)
Transition of Care [facility]) - Initial/Assessment Note    Patient Details  Name: Mandy Reyes MRN: [id number] Date of Birth: 1940-09-24  Transition of [facility]) CM/SW Contact:    Candie Chroman, LCSW Phone Number: 11/22/2022, 2:34 PM  Clinical Narrative:  Per palliative, son has chosen to pursue hospice/comfort care. Called son, Alvester Chou, He is interested in referral to [facility]. Referral made to [facility].                 Expected Discharge Plan: [place] (If approved) Barriers to Discharge: Other (must enter comment) (Hospice referral review)   Patient Goals and CMS Choice            Expected Discharge Plan and Services     Post Acute Care Choice: Hospice Living arrangements for the past 2 months: Group Home                                      Prior Living Arrangements/Services Living arrangements for the past 2 months: [place] Lives with:: Facility Resident Patient language and need for interpreter reviewed:: Yes Do you feel safe going back to the place where you live?: Yes      Need for Family Participation in Patient Care: Yes (Comment)     Criminal Activity/Legal Involvement Pertinent to Current Situation/Hospitalization: No - Comment as needed  Activities of Daily Living Home Assistive Devices/Equipment: None ADL Screening (condition at time of admission) Patient's cognitive ability adequate to safely complete daily activities?: No Is the patient deaf or have difficulty hearing?: No Does the patient have difficulty seeing, even when wearing glasses/contacts?: No Does the patient have difficulty concentrating, remembering, or making decisions?: Yes Patient able to express need for assistance with ADLs?: No Does the patient have difficulty dressing or bathing?: Yes Independently performs ADLs?: No Communication: Appropriate for developmental age Dressing (OT): Dependent Is this a change from baseline?: Change  from baseline, expected to last >3 days Grooming: Dependent Is this a change from baseline?: Change from baseline, expected to last >3 days Feeding: Dependent Is this a change from baseline?: Change from baseline, expected to last >3 days Bathing: Dependent Is this a change from baseline?: Change from baseline, expected to last >3 days Toileting: Dependent Is this a change from baseline?: Change from baseline, expected to last >3days In/Out Bed: Dependent Is this a change from baseline?: Change from baseline, expected to last >3 days Walks in Home: Dependent Is this a change from baseline?: Change from baseline, expected to last >3 days Does the patient have difficulty walking or climbing stairs?: Yes Weakness of Legs: Both Weakness of Arms/Hands: Both  Permission Sought/Granted Permission sought to share information with : Facility Sport and exercise psychologist, Family Supports    Share Information with NAME: Harvel Quale  Permission granted to share info w AGENCY: [place] granted to share info w Relationship: Son/Legal guardian  Permission granted to share info w Contact Information: [phone]  Emotional Assessment Appearance:: Appears stated age Attitude/Demeanor/Rapport: Unable to Assess Affect (typically observed): Unable to Assess Orientation: :  (Disoriented x 4) Alcohol / Substance Use: Not Applicable Psych Involvement: No (comment)  Admission diagnosis:  ICH (intracerebral hemorrhage) (HCC) [I61.9] SDH (subdural hematoma) ([place]) [S06.5XAA] Acute cystitis without hematuria [N30.00] Hematoma of scalp, initial encounter [S00.03XA] Fall, initial encounter [W19.XXXA] Traumatic right-sided intracerebral hemorrhage without loss of consciousness, initial encounter [facility]) [id number] Patient  Active Problem List   Diagnosis Date Noted   UTI (urinary tract infection) 11/19/2022   ICH (intracerebral hemorrhage) ([place]) 11/19/2022   Intracerebral  hemorrhage ([place]) 11/18/2022   Visual hallucinations 10/26/2015   Dementia ([place]) 08/05/2013   History of non anemic vitamin B12 deficiency 08/05/2013   Hypothyroidism 08/05/2013   PCP:  [facility] [place]:   [place], [place] [address] [place] [address] Phone: [phone] Fax: [phone]     Social Determinants of Health (SDOH) Social History: SDOH Screenings   Tobacco Use: Medium Risk (11/18/2022)   SDOH Interventions:     Readmission Risk Interventions     No data to display

## 2022-11-22 NOTE — Progress Notes (Signed)
Nutrition Brief Note  Chart reviewed. Pt now transitioning to comfort care.  No further nutrition interventions planned at this time.  Please re-consult as needed.   Edvardo Honse W, RD, LDN, CDCES Registered Dietitian II Certified Diabetes Care and Education Specialist Please refer to [facility] for RD and/or RD on-call/weekend/after hours pager   

## 2022-11-26 LAB — CULTURE, BLOOD (ROUTINE X 2)
Culture: NO GROWTH
Culture: NO GROWTH
Special Requests: ADEQUATE
Special Requests: ADEQUATE

## 2023-01-03 DEATH — deceased
